# Patient Record
Sex: Female | Born: 1990 | Race: Black or African American | Hispanic: No | Marital: Single | Smoking: Never smoker
Health system: Southern US, Community
[De-identification: ages and names within clinical notes are randomized; demographics above are authoritative.]

## PROBLEM LIST (undated history)

## (undated) DIAGNOSIS — G459 Transient cerebral ischemic attack, unspecified: Secondary | ICD-10-CM

## (undated) HISTORY — PX: INDUCED ABORTION: SHX677

---

## 2015-02-19 ENCOUNTER — Encounter (HOSPITAL_COMMUNITY): Payer: Self-pay | Admitting: Emergency Medicine

## 2015-02-19 ENCOUNTER — Emergency Department (HOSPITAL_COMMUNITY)
Admission: EM | Admit: 2015-02-19 | Discharge: 2015-02-19 | Disposition: A | Discharge status: Home / Self Care | Payer: Medicaid - Out of State | Attending: Emergency Medicine | Admitting: Emergency Medicine

## 2015-02-19 DIAGNOSIS — Z79899 Other long term (current) drug therapy: Secondary | ICD-10-CM | POA: Diagnosis not present

## 2015-02-19 DIAGNOSIS — L02412 Cutaneous abscess of left axilla: Secondary | ICD-10-CM | POA: Diagnosis present

## 2015-02-19 MED ORDER — LIDOCAINE HCL (PF) 2 % IJ SOLN
0.0000 mL | Freq: Once | INTRAMUSCULAR | Status: DC | PRN
  Filled 2015-02-19 (×3): qty 20

## 2015-02-19 NOTE — ED Notes (Signed)
Pt is pregnant

## 2015-02-19 NOTE — ED Provider Notes (Signed)
CSN: [id number]     Arrival date & time 02/19/15  1325 History  This chart was scribed for non-physician provider Oswaldo Conroy, PA-C, working with Rolan Bucco, MD by Phillis Haggis, ED Scribe. This patient was seen in room WTR8/WTR8 and patient care was started at 2:00 PM.   Chief Complaint  Patient presents with  . Abscess   Patient is a 24 y.o. female presenting with abscess. The history is provided by the patient. No language interpreter was used.  Abscess Associated symptoms: no fever, no nausea and no vomiting   HPI Comments: Marissa Hull is a 24 y.o. female who presents to the Emergency Department complaining of a left axillary abscess onset one week ago. Patient states that this is the second abscess she has had in the same area. Patient reports pain with movement and has not tried anything for the pain.  Patient denies fevers, chills, nausea, or vomiting. Patient denies chronic medical conditions. Patient states that she is currently [redacted] weeks pregnant.   History reviewed. No pertinent past medical history. History reviewed. No pertinent past surgical history. No family history on file. History  Substance Use Topics  . Smoking status: Never Smoker   . Smokeless tobacco: Not on file  . Alcohol Use: No   OB History    Gravida Para Term Preterm AB TAB SAB Ectopic Multiple Living   1              Review of Systems  Constitutional: Negative for fever and chills.  Gastrointestinal: Negative for nausea and vomiting.  Skin: Positive for wound. Negative for pallor.   Allergies  Review of patient's allergies indicates no known allergies.  Home Medications   Prior to Admission medications   Medication Sig Start Date End Date Taking? Authorizing Provider  Prenatal Vit-Fe Fumarate-FA (PRENATAL MULTIVITAMIN) TABS tablet Take 1 tablet by mouth daily at 12 noon.   Yes Historical Provider, MD   BP 116/77 mmHg  Pulse 90  Temp(Src) 98 F (36.7 C) (Oral)  Resp 20  SpO2 100%   LMP 11/24/2014   Physical Exam  Constitutional: She appears well-developed and well-nourished. No distress.  HENT:  Head: Normocephalic and atraumatic.  Eyes: Conjunctivae are normal. Right eye exhibits no discharge. Left eye exhibits no discharge.  Pulmonary/Chest: Effort normal. No respiratory distress.  Neurological: She is alert. Coordination normal.  Skin: She is not diaphoretic. No erythema.  3 x 2 cm fluctuant mass to left axilla; no red streaks, no spontaneous drainage, no surrounding erythema or induration.   Psychiatric: She has a normal mood and affect. Her behavior is normal.  Nursing note and vitals reviewed.   ED Course  Procedures (including critical care time) DIAGNOSTIC STUDIES: Oxygen Saturation is 100% on room air, normal by my interpretation.    COORDINATION OF CARE: 2:01 PM-Discussed treatment plan which includes I&D and follow-up with pt at bedside and pt agreed to plan.   INCISION AND DRAINAGE PROCEDURE NOTE: Patient identification was confirmed and verbal consent was obtained. This procedure was performed by Oswaldo Conroy, PA-C,  at 6:19 PM. Site: left axilla Sterile procedures observed Needle size: 21g  Anesthetic used (type and amt): Lidocaine 2% without epinephrine Incision made with scalpel Drainage: Copious Complexity: Complex Packing used 1/4 inch iodoform Site anesthetized, incision made over site, wound drained and explored loculations, rinsed with copious amounts of normal saline, wound packed with sterile gauze, covered with dry, sterile dressing.  Pt tolerated procedure well without complications.  Instructions for care discussed  verbally and pt provided with additional written instructions for homecare and f/u.  Labs Review Labs Reviewed - No data to display  Imaging Review No results found.   EKG Interpretation None      MDM   Final diagnoses:  Abscess of axilla, left   Patient with skin abscess amenable to incision and  drainage.  Abscess with packing, wound recheck in 3 days. Encouraged home warm soaks and flushing.  Mild signs of cellulitis is surrounding skin.  Will d/c to home.  No antibiotic therapy is indicated  Discussed return precautions with patient. Discussed all results and patient verbalizes understanding and agrees with plan.  I personally performed the services described in this documentation, which was scribed in my presence. The recorded information has been reviewed and is accurate.     Oswaldo ConroyVictoria Ayanah Snader, PA-C 02/19/15 1819  Rolan BuccoMelanie Belfi, MD 02/24/15 [phone]

## 2015-02-19 NOTE — ED Notes (Signed)
Pt has abscess in left axilla x week.  Pt denies any drainage but is painful.

## 2015-02-19 NOTE — Discharge Instructions (Signed)
Return to the emergency room with worsening of symptoms, new symptoms or with symptoms that are concerning, especially redness, swelling, pus, fevers, generalized ill feeling. Continue with warm soaks. Follow-up with urgent care for recheck in 3 days. Read below information and follow recommendations. Abscess Care After An abscess (also called a boil or furuncle) is an infected area that contains a collection of pus. Signs and symptoms of an abscess include pain, tenderness, redness, or hardness, or you may feel a moveable soft area under your skin. An abscess can occur anywhere in the body. The infection may spread to surrounding tissues causing cellulitis. A cut (incision) by the surgeon was made over your abscess and the pus was drained out. Gauze may have been packed into the space to provide a drain that will allow the cavity to heal from the inside outwards. The boil may be painful for 5 to 7 days. Most people with a boil do not have high fevers. Your abscess, if seen early, may not have localized, and may not have been lanced. If not, another appointment may be required for this if it does not get better on its own or with medications. HOME CARE INSTRUCTIONS   Only take over-the-counter or prescription medicines for pain, discomfort, or fever as directed by your caregiver.  When you bathe, soak and then remove gauze or iodoform packs at least daily or as directed by your caregiver. You may then wash the wound gently with mild soapy water. Repack with gauze or do as your caregiver directs. SEEK IMMEDIATE MEDICAL CARE IF:   You develop increased pain, swelling, redness, drainage, or bleeding in the wound site.  You develop signs of generalized infection including muscle aches, chills, fever, or a general ill feeling.  An oral temperature above 102 F (38.9 C) develops, not controlled by medication. See your caregiver for a recheck if you develop any of the symptoms described above. If  medications (antibiotics) were prescribed, take them as directed. Document Released: 05/16/2005 Document Revised: 01/20/2012 Document Reviewed: 01/11/2008 [facility] Patient Information 2015 [place]. This information is not intended to replace advice given to you by your health care provider. Make sure you discuss any questions you have with your health care provider.

## 2016-06-14 ENCOUNTER — Emergency Department
Admission: EM | Admit: 2016-06-14 | Discharge: 2016-06-14 | Disposition: A | Discharge status: Home / Self Care | Payer: Medicaid Other | Attending: Emergency Medicine | Admitting: Emergency Medicine

## 2016-06-14 ENCOUNTER — Emergency Department: Payer: MEDICAID

## 2016-06-14 DIAGNOSIS — T148XXA Other injury of unspecified body region, initial encounter: Secondary | ICD-10-CM

## 2016-06-14 DIAGNOSIS — S39012A Strain of muscle, fascia and tendon of lower back, initial encounter: Secondary | ICD-10-CM | POA: Insufficient documentation

## 2016-06-14 DIAGNOSIS — S29012A Strain of muscle and tendon of back wall of thorax, initial encounter: Secondary | ICD-10-CM | POA: Insufficient documentation

## 2016-06-14 MED ORDER — NAPROXEN 250 MG PO TABS
500.0000 mg | ORAL_TABLET | Freq: Once | ORAL | Status: AC
Start: 2016-06-14 — End: 2016-06-14
  Administered 2016-06-14: 500 mg via ORAL
  Filled 2016-06-14: qty 2

## 2016-06-14 MED ORDER — CYCLOBENZAPRINE HCL 10 MG PO TABS
10.0000 mg | ORAL_TABLET | Freq: Every evening | ORAL | Status: AC | PRN
Start: 2016-06-14 — End: 2016-06-29

## 2016-06-14 MED ORDER — NAPROXEN 500 MG PO TABS
500.0000 mg | ORAL_TABLET | Freq: Two times a day (BID) | ORAL | Status: DC
Start: 2016-06-14 — End: 2017-03-03

## 2016-06-14 NOTE — ED Notes (Signed)
Refer to quick triage. Pain 10/10

## 2016-06-14 NOTE — ED Notes (Signed)
The patient was seen and examined by PA or Fellow; plan of care was discussed with me. I have also seen and examined the patient personally and concur with PA/NP plan of care.     Loretha Stapler Eutha Cude  11:13 AM  06/14/2016          Latanya Presser, MD  06/14/16 [phone]

## 2016-06-14 NOTE — ED Notes (Signed)
Bed: N S3  Expected date:   Expected time:   Means of arrival:   Comments:
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
## 2016-06-14 NOTE — Discharge Instructions (Signed)
Dear Ms. Crystal Morrison:    Thank you for choosing the [facility] Emergency Department, the premier emergency department in the [place] area.  I hope your visit today was EXCELLENT.    Specific instructions for your visit today:    Continue with naproxen and flexeril as needed for pain.  Follow up with primary care provider.    Muscle Strain, General    You have been diagnosed with a muscle strain.    Any muscle in the body can be strained. A strain is an injury to muscles where some muscle fibers are injured by being stretched or partly torn. This usually happens from using the muscle too much or from doing an activity the muscle is not used to.    Some strain symptoms are pain, muscle cramping and soreness to the touch.    Often, muscle pain and stiffness are worse the next day. This is much like what happens when someone starts exercising for the first time. After exercising, the person may feel pretty good. However, the next day all the exercised muscles are stiff and sore.    General strain treatment includes:   Resting the affected part.   Pain medicine.   Muscle relaxant medicines.   Warm compresses (such as a warm, moist towel).   Gently stretching the injured muscle.   When tolerated, gently massaging the injured area.    This injury is self-limited (it gets better on its own). It rarely needs specific treatment.    YOU SHOULD SEEK MEDICAL ATTENTION IMMEDIATELY, EITHER HERE OR AT THE NEAREST EMERGENCY DEPARTMENT, IF ANY OF THE FOLLOWING OCCURS:   Major increase in swelling of the affected area.   Pain gets worse instead of gradually improving.   Skin gets red over the affected area.   Unable to use the affected limb. Limb weakness or numbness.                If you do not continue to improve or your condition worsens, please contact your doctor or return immediately to the Emergency Department.    Sincerely,  No att. providers found - Attending Emergency Physician  Doristine Mango, PA-C -  Physician Assistant  [facility] Emergency Department    ONSITE PHARMACY  Our full service onsite pharmacy is located in the ER waiting room.  Open 7 days a week from 9 am to 11 pm.  We accept all major insurances and prices are competitive with major retailers.  Ask your provider to print your prescriptions down to the pharmacy to speed you on your way home.    OBTAINING A PRIMARY CARE APPOINTMENT    Primary care physicians (PCPs, also known as primary care doctors) are either internists or family medicine doctors. Both types of PCPs focus on health promotion, disease prevention, patient education and counseling, and treatment of acute and chronic medical conditions.    Call for an appointment with a primary care doctor.  Ask to see who is taking new patients.     [facility]  telephone:  [phone]  [email or web address]    DOCTOR REFERRALS  Call [phone] (available 24 hours a day, 7 days a week) if you need any further referrals and we can help you find a primary care doctor or specialist.  Also, available online at:  [email or web address]    YOUR CONTACT INFORMATION  Before leaving please check with registration to make sure we have an up-to-date contact number.  You can call registration at [phone]  to update your information.  For questions about your hospital bill, please call [phone].  For questions about your Emergency Dept Physician bill please call [phone].      FREE HEALTH SERVICES  If you need help with health or social services, please call 2-1-1 for a free referral to resources in your area.  2-1-1 is a free service connecting people with information on health insurance, free clinics, pregnancy, mental health, dental care, food assistance, housing, and substance abuse counseling.  Also, available online at:  http://www.[place].org    MEDICAL RECORDS AND TESTS  Certain laboratory test results do not come back the same day, for example urine  cultures.   We will contact you if other important findings are noted.  Radiology films are often reviewed again to ensure accuracy.  If there is any discrepancy, we will notify you.      Please call [phone] to pick up a complimentary CD of any radiology studies performed.  If you or your doctor would like to request a copy of your medical records, please call [phone].      ORTHOPEDIC INJURY   Please know that significant injuries can exist even when an initial x-ray is read as normal or negative.  This can occur because some fractures (broken bones) are not initially visible on x-rays.  For this reason, close outpatient follow-up with your primary care doctor or bone specialist (orthopedist) is required.    MEDICATIONS AND FOLLOWUP  Please be aware that some prescription medications can cause drowsiness.  Use caution when driving or operating machinery.    The examination and treatment you have received in our Emergency Department is provided on an emergency basis, and is not intended to be a substitute for your primary care physician.  It is important that your doctor checks you again and that you report any new or remaining problems at that time.      24 HOUR PHARMACIES  The nearest 24 hour pharmacy is:    [facility]  [address]  [place], [place] [address]  [phone]      ASSISTANCE WITH INSURANCE    Affordable Care Act  [facility])  Call to start or finish an application, compare plans, enroll or ask a question.  [phone]  TTY: [phone]  Web:  Healthcare.gov    Help Enrolling in [facility]  [employer] [place]  407-009-4801 (TOLL-FREE)  (407)039-6955 (TTY)  Web:  Http://www.coverva.org    Local Help Enrolling in the [facility]  [place] [facility]  519-514-4109 (MAIN)  Email:  health-help@nvfs .org  Web:  BlackjackMyths.is  Address:  [address], [address] [place], [place] [address]    SEDATING MEDICATIONS  Sedating medications include strong pain medications  (e.g. narcotics), muscle relaxers, benzodiazepines (used for anxiety and as muscle relaxers), Benadryl/diphenhydramine and other antihistamines for allergic reactions/itching, and other medications.  If you are unsure if you have received a sedating medication, please ask your physician or nurse.  If you received a sedating medication: DO NOT drive a car. DO NOT operate machinery. DO NOT perform jobs where you need to be alert.  DO NOT drink alcoholic beverages while taking this medicine.     If you get dizzy, sit or lie down at the first signs. Be careful going up and down stairs.  Be extra careful to prevent falls.     Never give this medicine to others.     Keep this medicine out of reach of children.  Do not take or save old medicines. Throw them away when outdated.     Keep all medicines in a cool, dry place. DO NOT keep them in your bathroom medicine cabinet or in a cabinet above the stove.    MEDICATION REFILLS  Please be aware that we cannot refill any prescriptions through the ER. If you need further treatment from what is provided at your ER visit, please follow up with your primary care doctor or your pain management specialist.    FREESTANDING EMERGENCY DEPARTMENTS OF [facility]  Did you know [facility] has two freestanding ERs located just a few miles away?  [facility] [place] and [facility] [place] have short wait times, easy free parking directly in front of the building and top patient satisfaction scores - and the same Board Certified Emergency Medicine doctors as [facility].              DESTYNIE TOOMEY  [id number]  [phone]  [phone]  06/14/2016    Discharge Instructions    As always, you are the most important factor in your recovery.  Please follow these instructions carefully.  If you have problems that we have not discussed, CALL OR VISIT YOUR DOCTOR RIGHT AWAY.     If you can't reach your doctor, return to the emergency department.    I Vivi Martens  understand the written and discussed instructions.  My questions have been answered.  I acknowledge receipt of these instructions.     Patient or responsible person:         Patient's Signature               Physician or Nurse

## 2017-03-03 ENCOUNTER — Emergency Department: Payer: Self-pay

## 2017-03-03 ENCOUNTER — Emergency Department
Admission: EM | Admit: 2017-03-03 | Discharge: 2017-03-03 | Disposition: A | Discharge status: Home / Self Care | Payer: Self-pay | Attending: Emergency Medicine | Admitting: Emergency Medicine

## 2017-03-03 DIAGNOSIS — Y92410 Unspecified street and highway as the place of occurrence of the external cause: Secondary | ICD-10-CM | POA: Insufficient documentation

## 2017-03-03 DIAGNOSIS — Y990 Civilian activity done for income or pay: Secondary | ICD-10-CM | POA: Insufficient documentation

## 2017-03-03 DIAGNOSIS — M545 Low back pain: Secondary | ICD-10-CM | POA: Insufficient documentation

## 2017-03-03 DIAGNOSIS — M549 Dorsalgia, unspecified: Secondary | ICD-10-CM

## 2017-03-03 DIAGNOSIS — S8011XA Contusion of right lower leg, initial encounter: Secondary | ICD-10-CM | POA: Insufficient documentation

## 2017-03-03 LAB — URINE BHCG POC: Urine bHCG POC: NEGATIVE

## 2017-03-03 MED ORDER — CYCLOBENZAPRINE HCL 10 MG PO TABS
10.0000 mg | ORAL_TABLET | Freq: Three times a day (TID) | ORAL | 0 refills | Status: AC | PRN
Start: 2017-03-03 — End: ?

## 2017-03-03 MED ORDER — TRAMADOL HCL 50 MG PO TABS
50.0000 mg | ORAL_TABLET | Freq: Four times a day (QID) | ORAL | 0 refills | Status: AC | PRN
Start: 2017-03-03 — End: ?

## 2017-03-03 MED ORDER — NAPROXEN 250 MG PO TABS
500.0000 mg | ORAL_TABLET | Freq: Once | ORAL | Status: AC
Start: 2017-03-03 — End: 2017-03-03
  Administered 2017-03-03: 500 mg via ORAL
  Filled 2017-03-03: qty 2

## 2017-03-03 MED ORDER — CYCLOBENZAPRINE HCL 10 MG PO TABS
10.0000 mg | ORAL_TABLET | Freq: Once | ORAL | Status: AC
Start: 2017-03-03 — End: 2017-03-03
  Administered 2017-03-03: 10 mg via ORAL
  Filled 2017-03-03: qty 1

## 2017-03-03 MED ORDER — TRAMADOL HCL 50 MG PO TABS
50.0000 mg | ORAL_TABLET | Freq: Once | ORAL | Status: AC
Start: 2017-03-03 — End: 2017-03-03
  Administered 2017-03-03: 50 mg via ORAL
  Filled 2017-03-03: qty 1

## 2017-03-03 MED ORDER — HYDROCODONE-ACETAMINOPHEN 5-325 MG PO TABS
1.0000 | ORAL_TABLET | Freq: Once | ORAL | Status: AC
Start: 2017-03-03 — End: 2017-03-03
  Administered 2017-03-03: 1 via ORAL
  Filled 2017-03-03: qty 1

## 2017-03-03 MED ORDER — NAPROXEN 375 MG PO TABS
375.0000 mg | ORAL_TABLET | Freq: Two times a day (BID) | ORAL | 0 refills | Status: AC
Start: 2017-03-03 — End: ?
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
## 2018-06-08 ENCOUNTER — Emergency Department
Admission: EM | Admit: 2018-06-08 | Discharge: 2018-06-08 | Disposition: A | Discharge status: Home / Self Care | Payer: No Typology Code available for payment source | Attending: Emergency Medicine | Admitting: Emergency Medicine

## 2018-06-08 ENCOUNTER — Emergency Department: Payer: No Typology Code available for payment source

## 2018-06-08 DIAGNOSIS — R1111 Vomiting without nausea: Secondary | ICD-10-CM

## 2018-06-08 DIAGNOSIS — R1110 Vomiting, unspecified: Secondary | ICD-10-CM | POA: Insufficient documentation

## 2018-06-08 DIAGNOSIS — K769 Liver disease, unspecified: Secondary | ICD-10-CM | POA: Insufficient documentation

## 2018-06-08 DIAGNOSIS — R1084 Generalized abdominal pain: Secondary | ICD-10-CM | POA: Insufficient documentation

## 2018-06-08 LAB — HEMOLYSIS INDEX: Hemolysis Index: 316 — ABNORMAL HIGH (ref 0–18)

## 2018-06-08 LAB — COMPREHENSIVE METABOLIC PANEL
ALT: 15 U/L (ref 0–55)
AST (SGOT): 35 U/L — ABNORMAL HIGH (ref 5–34)
Albumin/Globulin Ratio: 0.9 (ref 0.9–2.2)
Albumin: 3.7 g/dL (ref 3.5–5.0)
Alkaline Phosphatase: 40 U/L (ref 37–106)
Anion Gap: 10 (ref 5.0–15.0)
BUN: 14 mg/dL (ref 7.0–19.0)
Bilirubin, Total: 0.2 mg/dL (ref 0.2–1.2)
CO2: 21 mEq/L — ABNORMAL LOW (ref 22–29)
Calcium: 8.9 mg/dL (ref 8.5–10.5)
Chloride: 107 mEq/L (ref 100–111)
Creatinine: 0.8 mg/dL (ref 0.6–1.0)
Globulin: 3.9 g/dL — ABNORMAL HIGH (ref 2.0–3.6)
Glucose: 101 mg/dL — ABNORMAL HIGH (ref 70–100)
Potassium: 4.8 mEq/L (ref 3.5–5.1)
Protein, Total: 7.6 g/dL (ref 6.0–8.3)
Sodium: 138 mEq/L (ref 136–145)

## 2018-06-08 LAB — CBC AND DIFFERENTIAL
Absolute NRBC: 0 10*3/uL (ref 0.00–0.00)
Basophils Absolute Automated: 0.04 10*3/uL (ref 0.00–0.08)
Basophils Automated: 0.6 %
Eosinophils Absolute Automated: 0.13 10*3/uL (ref 0.00–0.44)
Eosinophils Automated: 2.1 %
Hematocrit: 34.8 % (ref 34.7–43.7)
Hgb: 11.6 g/dL (ref 11.4–14.8)
Immature Granulocytes Absolute: 0.01 10*3/uL (ref 0.00–0.07)
Immature Granulocytes: 0.2 %
Lymphocytes Absolute Automated: 2.64 10*3/uL (ref 0.42–3.22)
Lymphocytes Automated: 42.2 %
MCH: 30.1 pg (ref 25.1–33.5)
MCHC: 33.3 g/dL (ref 31.5–35.8)
MCV: 90.4 fL (ref 78.0–96.0)
MPV: 9.7 fL (ref 8.9–12.5)
Monocytes Absolute Automated: 0.39 10*3/uL (ref 0.21–0.85)
Monocytes: 6.2 %
Neutrophils Absolute: 3.05 10*3/uL (ref 1.10–6.33)
Neutrophils: 48.7 %
Nucleated RBC: 0 /100 WBC (ref 0.0–0.0)
Platelets: 274 10*3/uL (ref 142–346)
RBC: 3.85 10*6/uL — ABNORMAL LOW (ref 3.90–5.10)
RDW: 13 % (ref 11–15)
WBC: 6.26 10*3/uL (ref 3.10–9.50)

## 2018-06-08 LAB — LIPASE: Lipase: 28 U/L (ref 8–78)

## 2018-06-08 LAB — URINALYSIS, REFLEX TO MICROSCOPIC EXAM IF INDICATED
Bilirubin, UA: NEGATIVE
Blood, UA: NEGATIVE
Glucose, UA: NEGATIVE
Ketones UA: NEGATIVE
Leukocyte Esterase, UA: NEGATIVE
Nitrite, UA: NEGATIVE
Protein, UR: NEGATIVE
Specific Gravity UA: 1.028 (ref 1.001–1.035)
Urine pH: 6 (ref 5.0–8.0)
Urobilinogen, UA: NEGATIVE mg/dL

## 2018-06-08 LAB — GFR: EGFR: >60

## 2018-06-08 LAB — URINE BHCG POC: Urine bHCG POC: NEGATIVE

## 2018-06-08 MED ORDER — ONDANSETRON HCL 4 MG/2ML IJ SOLN
4.0000 mg | Freq: Once | INTRAMUSCULAR | Status: AC
Start: 2018-06-08 — End: 2018-06-08
  Administered 2018-06-08: 4 mg via INTRAVENOUS
  Filled 2018-06-08: qty 2

## 2018-06-08 MED ORDER — IOHEXOL 350 MG/ML IV SOLN
100.0000 mL | Freq: Once | INTRAVENOUS | Status: AC | PRN
Start: 2018-06-08 — End: 2018-06-08
  Administered 2018-06-08: 100 mL via INTRAVENOUS

## 2018-06-08 MED ORDER — MORPHINE SULFATE 4 MG/ML IJ/IV SOLN (WRAP)
4.0000 mg | Freq: Once | Status: AC
Start: 2018-06-08 — End: 2018-06-08
  Administered 2018-06-08: 4 mg via INTRAVENOUS
  Filled 2018-06-08: qty 1

## 2018-06-08 MED ORDER — SODIUM CHLORIDE 0.9 % IV BOLUS
1000.0000 mL | Freq: Once | INTRAVENOUS | Status: AC
Start: 2018-06-08 — End: 2018-06-08
  Administered 2018-06-08: 1000 mL via INTRAVENOUS

## 2018-06-08 MED ORDER — ONDANSETRON 4 MG PO TBDP
4.0000 mg | ORAL_TABLET | Freq: Four times a day (QID) | ORAL | 0 refills | Status: DC | PRN
Start: 2018-06-08 — End: 2018-09-27

## 2018-06-08 NOTE — Discharge Instructions (Signed)
You were seen in the [facility] Emergency Department by Dr. Reginia Forts    For home care, please take medication as directed.  Follow-up with your primary care doctor for further evaluation of liver lesions that were seen on CT scan in the next 2 to 3 days.  You will need an MRI to further evaluate the cause of these liver lesions.     Please make sure to return if you have return of abdominal pain, repeated vomiting, blood in your vomit or stool, fever greater than 100.4 F, chest pain, shortness of breath, back pain, difficulty urinating if you are not getting better, or any other new or worsening or concerning symptoms.  We are always open and happy to help.    Please be aware that an emergency department diagnosis is usually preliminary, and that a definitive diagnosis cannot be made without the help of time or further testing.  Every disease has a progression and may take time before it is recognizable by a physician.  It is extremely important that you return to the Emergency Department or see your own doctor if you do not improve, or especially if your symptoms worsen or change.     In short, do not hesitate to return to the emergency department if you sense something is not right. We are never upset to see you again.    Take your discharge instructions to your primary care doctor and all other follow-up care so they have a record of what happened in your visit today. Emergency care is not a substitute for general medical care.    Your CT showed multiple non emergent liver lesions.  Please schedule an MRI at your earliest convenience.       Abdominal Pain    You have been diagnosed with abdominal (belly) pain. The cause of your pain is not yet known.    Many things can cause abdominal pain. Examples include viral infections and bowel (intestine) spasms. You might need another examination or more tests to find out why you have pain.    At this time, your pain does not seem to be caused by  anything dangerous. You do not need surgery. You do not need to stay in the hospital.     Though we don't believe your condition is dangerous right now, it is important to be careful. Sometimes a problem that seems mild can become serious later. This is why it is very important that you return here or go to the nearest Emergency Department unless you are 100% improved.    YOU SHOULD SEEK MEDICAL ATTENTION IMMEDIATELY, EITHER HERE OR AT THE NEAREST EMERGENCY DEPARTMENT, IF ANY OF THE FOLLOWING OCCURS:   Your pain does not go away or gets worse.   You cannot keep fluids down or your vomit is dark green.    You vomit blood or see blood in your stool. Blood might be bright red or dark red. It can also be black and look like tar.   You have a fever (temperature higher than 100.52F / 38C) or shaking chills.   Your skin or eyes look yellow or your urine looks brown.   You have severe diarrhea.      Vomiting    You have been seen for vomiting.    Vomiting (throwing-up) can be caused by many different things. Most of the time the cause IS NOT serious. The doctor feels it is OK for you to go home today.    Common  causes of vomiting include the following:   Gastroenteritis (stomach flu), usually with diarrhea.   Other illnesses. Sometimes medical conditions like diabetes, heart problems, headaches, or infections can make someone throw up.    Bowel obstructions (blockages) can cause vomiting and make patients unable to have bowel movements (stool) or pass gas.   Vomiting can be a symptom of appendicitis, especially if there is also pain in the right lower abdomen (belly).    Sometimes it is hard to find out what is causing the vomiting. Vomiting can be treated with anti-nausea medicines like promethazine (Phenergan), prochlorperazine (Compazine) or ondansetron (Zofran).    Try to drink liquids to avoid dehydration. Don't drink a lot of fluid all at once. Take small sips throughout the day.    YOU SHOULD  SEEK MEDICAL ATTENTION IMMEDIATELY, EITHER HERE OR AT THE NEAREST EMERGENCY DEPARTMENT, IF ANY OF THE FOLLOWING OCCURS:   You can't stop vomiting or your vomiting doesn't get better with medication.   You cannot keep liquids down.   You have severe sudden chest or belly pain after vomiting.   You have abdominal pain.

## 2018-06-08 NOTE — ED Provider Notes (Signed)
EMERGENCY DEPARTMENT HISTORY AND PHYSICAL EXAM     Physician/Midlevel provider first contact with patient: 06/08/18 0432         Date: 06/08/2018  Patient Name: Crystal Morrison    History of Presenting Illness     Chief Complaint   Patient presents with   . Abdominal Pain       History Provided By: Patient    Chief Complaint: abd pain  Duration: a few hours PTA  Timing:  Acute  Location: left sided  Quality: uncomfortable  Severity: Moderate  Exacerbating factors:   Alleviating factors:   Associated Symptoms: vomiting  Pertinent Negatives: bloody stools, hematemesis, diarrhea, fever, abd surgery, vaginal bleeding, vaginal discharge, possibility of being pregnant    Additional History: Crystal Morrison is a 27 y.o. female with a hx of gall stones presenting to the ED with acute left sided abd pain with associated vomiting.  Pt states she woke up a few hours PTA and vomited in bed with 3 more episodes of vomiting.  The vomiting has resolved but her abd still hurts.  Denies bloody stools, hematemesis, diarrhea, fever, vaginal bleeding, vaginal discharge, possibility of being pregnant, or hx of abd surgery or pancreatitis.    PCP: Pcp, [place], MD  SPECIALISTS:    No current facility-administered medications for this encounter.      Current Outpatient Prescriptions   Medication Sig Dispense Refill   . cyclobenzaprine (FLEXERIL) 10 MG tablet Take 1 tablet (10 mg total) by mouth 3 (three) times daily as needed (pain to muscles). 12 tablet 0   . naproxen (NAPROSYN) 375 MG tablet Take 1 tablet (375 mg total) by mouth 2 (two) times daily with meals. 14 tablet 0   . ondansetron (ZOFRAN-ODT) 4 MG disintegrating tablet Take 1 tablet (4 mg total) by mouth every 6 (six) hours as needed for Nausea 8 tablet 0   . traMADol (ULTRAM) 50 MG tablet Take 1 tablet (50 mg total) by mouth every 6 (six) hours as needed for Pain.Do not drive or operate machinery while taking this medication 8 tablet 0       Past History     Past  Medical History:  History reviewed. No pertinent past medical history.    Past Surgical History:  Past Surgical History:   Procedure Laterality Date   . ANKLE SURGERY Right    . WRIST SURGERY Right        Family History:  No family history on file.    Social History:  Social History   Substance Use Topics   . Smoking status: Never Smoker   . Smokeless tobacco: Never Used   . Alcohol use Yes      Comment: socially       Allergies:  No Known Allergies    Review of Systems     Review of Systems   Constitutional: Negative for fever.   HENT: Negative for sore throat.    Eyes: Negative for visual disturbance.   Respiratory: Negative for shortness of breath.    Cardiovascular: Negative for chest pain.   Gastrointestinal: Positive for abdominal pain and vomiting. Negative for blood in stool.   Genitourinary: Negative for dysuria, vaginal bleeding and vaginal discharge.   Musculoskeletal: Negative for back pain.   Skin: Negative for rash.   Neurological: Negative for headaches.     Physical Exam   BP 126/84   Pulse (!) 59   Temp 98.5 F (36.9 C) (Oral)   Resp 16   LMP  05/27/2018 (Exact Date)   SpO2 100%     Physical Exam   Constitutional: She is oriented to person, place, and time. She appears well-developed and well-nourished. No distress.   HENT:   Head: Normocephalic and atraumatic.   Mouth/Throat: Oropharynx is clear and moist.   Eyes: Pupils are equal, round, and reactive to light. EOM are normal.   Neck: Normal range of motion. Neck supple.   Cardiovascular: Normal rate, regular rhythm and intact distal pulses.    Pulmonary/Chest: Effort normal and breath sounds normal. No respiratory distress. She has no wheezes.   Abdominal: Soft. She exhibits no distension. There is tenderness in the epigastric area, left upper quadrant and left lower quadrant. There is no rigidity, no rebound, no guarding, no CVA tenderness, no tenderness at McBurney's point and negative Murphy's sign.   Musculoskeletal: Normal range of  motion.   Neurological: She is alert and oriented to person, place, and time.   Skin: Skin is warm and dry.   Psychiatric: She has a normal mood and affect.   Vitals reviewed.    Diagnostic Study Results     Labs -     Results     Procedure Component Value Units Date/Time    Comprehensive metabolic panel [[id number]]  (Abnormal) Collected:  06/08/18 0505    Specimen:  Blood Updated:  06/08/18 0537     Glucose 101 (H) mg/dL      BUN 08.6 mg/dL      Creatinine 0.8 mg/dL      Sodium 578 mEq/L      Potassium 4.8 mEq/L      Chloride 107 mEq/L      CO2 21 (L) mEq/L      Calcium 8.9 mg/dL      Protein, Total 7.6 g/dL      Albumin 3.7 g/dL      AST (SGOT) 35 (H) U/L      ALT 15 U/L      Alkaline Phosphatase 40 U/L      Bilirubin, Total 0.2 mg/dL      Globulin 3.9 (H) g/dL      Albumin/Globulin Ratio 0.9     Anion Gap 10.0    Lipase [[id number]] Collected:  06/08/18 0505    Specimen:  Blood Updated:  06/08/18 0537     Lipase 28 U/L     Hemolysis index [[id number]]  (Abnormal) Collected:  06/08/18 0505     Updated:  06/08/18 0537     Hemolysis Index 316 (H)    GFR [[id number]] Collected:  06/08/18 0505     Updated:  06/08/18 0537     EGFR >60.0    UA, Reflex to Microscopic (pts 3 + yrs) [[id number]] Collected:  06/08/18 0505    Specimen:  Urine Updated:  06/08/18 0526     Urine Type Clean Catch     Color, UA Yellow     Clarity, UA Clear     Specific Gravity UA 1.028     Urine pH 6.0     Leukocyte Esterase, UA Negative     Nitrite, UA Negative     Protein, UR Negative     Glucose, UA Negative     Ketones UA Negative     Urobilinogen, UA Negative mg/dL      Bilirubin, UA Negative     Blood, UA Negative    Urine BHCG POC [[id number]] Collected:  06/08/18 0520     Updated:  06/08/18 0525     Urine bHCG  POC Negative    CBC with differential [[id number]]  (Abnormal) Collected:  06/08/18 0505    Specimen:  Blood from Blood Updated:  06/08/18 0521     WBC 6.26 x10 3/uL      Hgb 11.6 g/dL      Hematocrit 81.1 %      Platelets 274 x10 3/uL       RBC 3.85 (L) x10 6/uL      MCV 90.4 fL      MCH 30.1 pg      MCHC 33.3 g/dL      RDW 13 %      MPV 9.7 fL      Neutrophils 48.7 %      Lymphocytes Automated 42.2 %      Monocytes 6.2 %      Eosinophils Automated 2.1 %      Basophils Automated 0.6 %      Immature Granulocyte 0.2 %      Nucleated RBC 0.0 /100 WBC      Neutrophils Absolute 3.05 x10 3/uL      Abs Lymph Automated 2.64 x10 3/uL      Abs Mono Automated 0.39 x10 3/uL      Abs Eos Automated 0.13 x10 3/uL      Absolute Baso Automated 0.04 x10 3/uL      Absolute Immature Granulocyte 0.01 x10 3/uL      Absolute NRBC 0.00 x10 3/uL           Radiologic Studies -   Radiology Results (24 Hour)     Procedure Component Value Units Date/Time    CT Abd/Pelvis with IV Contrast only [[id number]] Collected:  06/08/18 2130    Order Status:  Completed Updated:  06/08/18 8657    Narrative:       CT ABDOMEN PELVIS W IV/ WO PO CONT      HISTORY: generalized and left sided abd pain.        COMPARISON: None.    TECHNIQUE: Multiple axial CT images were obtained through the abdomen  and pelvis after the administration of 100 cc of Omnipaque 350   intravenous contrast. Oral contrast was not given. Coronal and sagittal  MPR  images were reconstructed. One of the following dose optimization  techniques was utilized in the performance of this exam: Automated  exposure control; adjustment of the mA and/or kV according to the  patient's size; or use of an iterative  reconstruction technique.   Specific details can be referenced in the facility's radiology CT exam  operational policy.    FINDINGS:    Lung bases:  Clear.    Hepatobiliary: There are multiple enhancing lesions in the liver. The  largest is in segment 6 measuring 2.1 x 1.8 cm. The gallbladder is  normal. The common bile duct is normal in size.     Spleen: Normal.    Pancreas: Normal.    Adrenals/Kidneys: The adrenal glands are normal. Kidneys are normal. No  stones or hydronephrosis.        Bowel/Mesentery/peritoneum:  Appendix is normal. Bowel is normal in  caliber.     No free air. No free fluid.    Lymph nodes: No enlarged lymph nodes.    Vessels: Negative.    Pelvic GU: The bladder is normal. The uterus is unremarkable.    Other: Negative.    Musculoskeletal: There are no suspicious bony lesions.          Impression:  1. Multiple enhancing lesions in the liver are indeterminate but  possibly represent benign lesions such as hemangiomas or focal nodular  hyperplasia. Follow-up with nonemergent MRI of the abdomen with and  without contrast is recommended.    Jorene Guest, MD   06/08/2018 6:29 AM      .    Medical Decision Making   I am the first provider for this patient.    I reviewed the vital signs, available nursing notes, past medical history, past surgical history, family history and social history.    Vital Signs-Reviewed the patient's vital signs.     Patient Vitals for the past 12 hrs:   BP Temp Pulse Resp   06/08/18 0623 126/84 - (!) 59 16   06/08/18 0421 112/71 98.5 F (36.9 C) 69 16       Pulse Oximetry Analysis - Normal 99% on RA    Cardiac Monitor:  Rate: 59  Rhythm:  Normal Sinus Rhythm   Time: 0623    Old Medical Records: Nursing notes.     ED Course:     4:34 AM - Discussed plan for testing and anti emetics with pt, who agrees.    7:01 AM - Updated pt on CT results. Discussed f/u with PCP, usage of Zofran, home self care, discharge instructions, and return precautions with patient. Possibility of evolving illness reviewed. All questions solicited and addressed. Patient states understanding and amenable to discharge.      Provider Notes:   27 year old female presenting with left-sided abdominal pain nonbilious nonbloody emesis after excessive consumption of alcohol.  Laboratory studies without signs of acute pathology.  CT scan shows multiple liver lesions without signs of other acute pathology.  Discussed with patient necessity to obtain outpatient MRI and continued evaluation with PCP regarding liver  lesions and that cancer and other pathology cannot be ruled out at this time until further work-up obtained.  Patient agreed and understood plan all questions answered.  IV fluids and Zofran given with improvement of symptoms.    I have considered possible diagnosis for the patient's presentation and based on the history and exam and studies, I do not feel she has ectopic pregnancy/toa/pid/torsion/obstruction/chole/appy/ascending biliary illness/pancreatitis/ruptured aaa/pyelonephritis OR other sbi/surgical pathology.      On revaluation and discharge patient well appearing, with No acute distress, abdomen soft nontender, tolerating PO, D/W patient ED workup. d/w patient no evidence of significant other etiologies to explain patient presentation. Also discussed red flags, concerning symptoms, reviewed possibility of progression of disease and diagnostic uncertainty.  Reviewed return precautions in detail. Will D/c patient w/ close f/u w/ PMD or ED if patient unable to have a follow up appointment. Will return with any worsening symptoms or any other concerns for further work up. All questions were answered, and the patient is comfortable with the disposition. Patient agreed and understood plan.   Diagnosis     Clinical Impression:   1. Generalized abdominal pain    2. Liver lesion    3. Non-intractable vomiting without nausea, unspecified vomiting type        Treatment Plan:   ED Disposition     ED Disposition Condition Date/Time Comment    Discharge  Mon Jun 08, 2018  6:47 AM Crystal Morrison discharge to home/self care.    Condition at disposition: Stable            _______________________________      Attestations: This note is prepared by Mathis Fare, acting as scribe for  Reginia Forts, MD.    Reginia Forts, MD - The scribe's documentation has been prepared under my direction and personally reviewed by me in its entirety.  I confirm that the note above accurately reflects all work,  treatment, procedures, and medical decision making performed by me.    _______________________________       Marko Stai, MD  06/10/18 [phone]

## 2018-06-08 NOTE — ED Triage Notes (Signed)
Pt reports burning abdominal pain with 4 episodes of vomiting starting at 0200.

## 2018-09-27 ENCOUNTER — Emergency Department
Admission: EM | Admit: 2018-09-27 | Discharge: 2018-09-27 | Disposition: A | Discharge status: Home / Self Care | Payer: Medicaid Other | Attending: Emergency Medicine | Admitting: Emergency Medicine

## 2018-09-27 ENCOUNTER — Emergency Department: Payer: Medicaid Other

## 2018-09-27 DIAGNOSIS — K529 Noninfective gastroenteritis and colitis, unspecified: Secondary | ICD-10-CM | POA: Insufficient documentation

## 2018-09-27 DIAGNOSIS — R197 Diarrhea, unspecified: Secondary | ICD-10-CM

## 2018-09-27 LAB — URINALYSIS, REFLEX TO MICROSCOPIC EXAM IF INDICATED
Bilirubin, UA: NEGATIVE
Blood, UA: NEGATIVE
Glucose, UA: NEGATIVE
Ketones UA: NEGATIVE
Leukocyte Esterase, UA: NEGATIVE
Nitrite, UA: NEGATIVE
Protein, UR: 30 — AB
Specific Gravity UA: 1.033 (ref 1.001–1.035)
Urine pH: 5 (ref 5.0–8.0)
Urobilinogen, UA: NEGATIVE mg/dL (ref 0.2–2.0)

## 2018-09-27 LAB — CBC AND DIFFERENTIAL
Absolute NRBC: 0 10*3/uL (ref 0.00–0.00)
Basophils Absolute Automated: 0.01 10*3/uL (ref 0.00–0.08)
Basophils Automated: 0.3 %
Eosinophils Absolute Automated: 0 10*3/uL (ref 0.00–0.44)
Eosinophils Automated: 0 %
Hematocrit: 40.2 % (ref 34.7–43.7)
Hgb: 13.6 g/dL (ref 11.4–14.8)
Immature Granulocytes Absolute: 0.02 10*3/uL (ref 0.00–0.07)
Immature Granulocytes: 0.6 %
Lymphocytes Absolute Automated: 0.75 10*3/uL (ref 0.42–3.22)
Lymphocytes Automated: 23.2 %
MCH: 30.4 pg (ref 25.1–33.5)
MCHC: 33.8 g/dL (ref 31.5–35.8)
MCV: 89.9 fL (ref 78.0–96.0)
MPV: 9.2 fL (ref 8.9–12.5)
Monocytes Absolute Automated: 0.33 10*3/uL (ref 0.21–0.85)
Monocytes: 10.2 %
Neutrophils Absolute: 2.12 10*3/uL (ref 1.10–6.33)
Neutrophils: 65.7 %
Nucleated RBC: 0 /100 WBC (ref 0.0–0.0)
Platelets: 256 10*3/uL (ref 142–346)
RBC: 4.47 10*6/uL (ref 3.90–5.10)
RDW: 13 % (ref 11–15)
WBC: 3.23 10*3/uL (ref 3.10–9.50)

## 2018-09-27 LAB — COMPREHENSIVE METABOLIC PANEL
ALT: 14 U/L (ref 0–55)
AST (SGOT): 25 U/L (ref 5–34)
Albumin/Globulin Ratio: 1.2 (ref 0.9–2.2)
Albumin: 3.8 g/dL (ref 3.5–5.0)
Alkaline Phosphatase: 44 U/L (ref 37–106)
Anion Gap: 8 (ref 5.0–15.0)
BUN: 8 mg/dL (ref 7.0–19.0)
Bilirubin, Total: 0.4 mg/dL (ref 0.2–1.2)
CO2: 25 mEq/L (ref 22–29)
Calcium: 8.6 mg/dL (ref 8.5–10.5)
Chloride: 104 mEq/L (ref 100–111)
Creatinine: 0.8 mg/dL (ref 0.6–1.0)
Globulin: 3.3 g/dL (ref 2.0–3.6)
Glucose: 91 mg/dL (ref 70–100)
Potassium: 3.7 mEq/L (ref 3.5–5.1)
Protein, Total: 7.1 g/dL (ref 6.0–8.3)
Sodium: 137 mEq/L (ref 136–145)

## 2018-09-27 LAB — LIPASE: Lipase: 16 U/L (ref 8–78)

## 2018-09-27 LAB — GFR: EGFR: >60

## 2018-09-27 LAB — HEMOLYSIS INDEX: Hemolysis Index: 4 (ref 0–18)

## 2018-09-27 LAB — URINE BHCG POC: Urine bHCG POC: NEGATIVE

## 2018-09-27 MED ORDER — PROMETHAZINE HCL 25 MG/ML IJ SOLN
12.50 mg | Freq: Once | INTRAMUSCULAR | Status: DC
Start: 2018-09-27 — End: 2018-09-27

## 2018-09-27 MED ORDER — ONDANSETRON 4 MG PO TBDP
4.00 mg | ORAL_TABLET | Freq: Four times a day (QID) | ORAL | 0 refills | Status: AC | PRN
Start: 2018-09-27 — End: ?

## 2018-09-27 MED ORDER — FAMOTIDINE 10 MG/ML IV SOLN (WRAP)
20.00 mg | Freq: Once | INTRAVENOUS | Status: AC
Start: 2018-09-27 — End: 2018-09-27
  Administered 2018-09-27: 20:00:00 20 mg via INTRAVENOUS
  Filled 2018-09-27: qty 2

## 2018-09-27 MED ORDER — ONDANSETRON 4 MG PO TBDP
4.00 mg | ORAL_TABLET | Freq: Once | ORAL | Status: AC
Start: 2018-09-27 — End: 2018-09-27
  Administered 2018-09-27: 20:00:00 4 mg via ORAL
  Filled 2018-09-27: qty 1

## 2018-09-27 MED ORDER — PROMETHAZINE HCL 25 MG/ML IJ SOLN
12.50 mg | Freq: Once | INTRAMUSCULAR | Status: AC
Start: 2018-09-27 — End: 2018-09-27
  Administered 2018-09-27: 21:00:00 12.5 mg via INTRAVENOUS
  Filled 2018-09-27 (×2): qty 1

## 2018-09-27 MED ORDER — SODIUM CHLORIDE 0.9 % IV BOLUS
1000.00 mL | Freq: Once | INTRAVENOUS | Status: AC
Start: 2018-09-27 — End: 2018-09-27
  Administered 2018-09-27: 20:00:00 1000 mL via INTRAVENOUS

## 2018-09-27 MED ORDER — DIPHENOXYLATE-ATROPINE 2.5-0.025 MG PO TABS
1.0000 | ORAL_TABLET | Freq: Four times a day (QID) | ORAL | Status: DC | PRN
Start: 2018-09-27 — End: 2018-09-28
  Administered 2018-09-27: 22:00:00 1 via ORAL
  Filled 2018-09-27: qty 1

## 2018-09-27 MED ORDER — IOHEXOL 350 MG/ML IV SOLN
100.00 mL | Freq: Once | INTRAVENOUS | Status: AC | PRN
Start: 2018-09-27 — End: 2018-09-27
  Administered 2018-09-27: 21:00:00 100 mL via INTRAVENOUS

## 2018-09-27 MED ORDER — DIPHENOXYLATE-ATROPINE 2.5-0.025 MG PO TABS
1.00 | ORAL_TABLET | Freq: Four times a day (QID) | ORAL | 0 refills | Status: AC | PRN
Start: 2018-09-27 — End: ?

## 2018-09-27 MED ORDER — KETOROLAC TROMETHAMINE 30 MG/ML IJ SOLN
30.00 mg | Freq: Once | INTRAMUSCULAR | Status: AC
Start: 2018-09-27 — End: 2018-09-27
  Administered 2018-09-27: 20:00:00 30 mg via INTRAVENOUS
  Filled 2018-09-27: qty 1

## 2018-09-27 NOTE — ED Provider Notes (Signed)
EMERGENCY DEPARTMENT HISTORY AND PHYSICAL EXAM     Physician/Midlevel provider first contact with patient: 09/27/18 1917         Date: 09/27/2018  Patient Name: Crystal Morrison    History of Presenting Illness     Chief Complaint   Patient presents with   . Emesis   . Diarrhea       History Provided By: Patient    Chief Complaint: abd pain  Duration: yesterday  Timing:  Constant  Location: epigastric  Quality: aching  Severity: Moderate  Exacerbating factors: none  Alleviating factors: none  Associated Symptoms: n/v/d  Pertinent Negatives: denies blood in the stool, fever    Additional History: Crystal Morrison is a 27 y.o. female presenting to the ED with constant aching epigastric abd pain with associated n/v/d since yesterday. Reports 7 episodes of NB emesis and 10+ episodes of diarrhea. No recent sick contact. Denies blood in the stool or fevers.       PCP: Pcp, None, MD  SPECIALISTS:    Current Facility-Administered Medications   Medication Dose Route Frequency Provider Last Rate Last Dose   . diphenoxylate-atropine (LOMOTIL) 2.5-0.025 MG per tablet 1 tablet  1 tablet Oral 4X Daily PRN Cherlyn Roberts, MD         Current Outpatient Medications   Medication Sig Dispense Refill   . cyclobenzaprine (FLEXERIL) 10 MG tablet Take 1 tablet (10 mg total) by mouth 3 (three) times daily as needed (pain to muscles). 12 tablet 0   . diphenoxylate-atropine (LOMOTIL) 2.5-0.025 MG per tablet Take 1 tablet by mouth 4 (four) times daily as needed for Diarrhea 12 tablet 0   . naproxen (NAPROSYN) 375 MG tablet Take 1 tablet (375 mg total) by mouth 2 (two) times daily with meals. 14 tablet 0   . ondansetron (ZOFRAN-ODT) 4 MG disintegrating tablet Take 1 tablet (4 mg total) by mouth every 6 (six) hours as needed for Nausea 8 tablet 0   . traMADol (ULTRAM) 50 MG tablet Take 1 tablet (50 mg total) by mouth every 6 (six) hours as needed for Pain.Do not drive or operate machinery while taking this medication 8 tablet 0        Past History     Past Medical History:  History reviewed. No pertinent past medical history.    Past Surgical History:  Past Surgical History:   Procedure Laterality Date   . ANKLE SURGERY Right    . WRIST SURGERY Right        Family History:  History reviewed. No pertinent family history.    Social History:  Social History     Tobacco Use   . Smoking status: Never Smoker   . Smokeless tobacco: Never Used   Substance Use Topics   . Alcohol use: Yes     Comment: socially   . Drug use: No       Allergies:  No Known Allergies    Review of Systems     Review of Systems   Constitutional: Negative for chills and fever.   HENT: Negative for congestion.    Eyes: Negative for visual disturbance.   Respiratory: Negative for cough.    Cardiovascular: Negative for chest pain.   Gastrointestinal: Positive for abdominal pain, diarrhea, nausea and vomiting. Negative for blood in stool.   Genitourinary: Negative for dysuria.   Musculoskeletal: Negative for myalgias.   Neurological: Negative for dizziness, numbness and headaches.   Psychiatric/Behavioral: Negative for confusion.  Physical Exam   BP 99/62   Pulse 76   Temp 98 F (36.7 C) (Oral)   Resp 12   Wt 66.2 kg   SpO2 98%   BMI 29.49 kg/m     Physical Exam   Constitutional: Oriented to person, place, and time  and in no distress.   Head: Normocephalic and atraumatic.   Mouth/Throat: Oropharynx is clear and moist.   Eyes: Conjunctivae normal and EOM are normal. Pupils are equal, round, and reactive to light.    Neck: Normal range of motion. Neck supple.   Cardiovascular: Normal rate, regular rhythm, normal heart sounds and intact distal pulses.  No murmur heard.  Pulmonary/Chest: Effort normal and breath sounds normal.   Abdominal: Soft. Non distended. There is epigastric and RLQ tenderness. No rebound or guarding  Musculoskeletal: No peripheral edema. No calf swelling or tenderness.    Neurological: Patient is alert and oriented to person, place, and time.  No cranial nerve deficit.  GCS score is 15.   Skin: Skin is warm and dry. No rash  Psychiatric: Affect normal.         Diagnostic Study Results     Labs -     Results     Procedure Component Value Units Date/Time    UA, Reflex to Microscopic (pts  3 + yrs) [[id number]]  (Abnormal) Collected:  09/27/18 1937    Specimen:  Urine Updated:  09/27/18 2005     Urine Type Clean Catch     Color, UA Amber     Clarity, UA Clear     Specific Gravity UA 1.033     Urine pH 5.0     Leukocyte Esterase, UA Negative     Nitrite, UA Negative     Protein, UR 30     Glucose, UA Negative     Ketones UA Negative     Urobilinogen, UA Negative mg/dL      Bilirubin, UA Negative     Blood, UA Negative     RBC, UA 0 - 2 /hpf      WBC, UA 0 - 5 /hpf      Squamous Epithelial Cells, Urine 0 - 5 /hpf      Urine Mucus Present    Comprehensive metabolic panel [[id number]] Collected:  09/27/18 1937    Specimen:  Blood Updated:  09/27/18 2004     Glucose 91 mg/dL      BUN 8.0 mg/dL      Creatinine 0.8 mg/dL      Sodium 782 mEq/L      Potassium 3.7 mEq/L      Chloride 104 mEq/L      CO2 25 mEq/L      Calcium 8.6 mg/dL      Protein, Total 7.1 g/dL      Albumin 3.8 g/dL      AST (SGOT) 25 U/L      ALT 14 U/L      Alkaline Phosphatase 44 U/L      Bilirubin, Total 0.4 mg/dL      Globulin 3.3 g/dL      Albumin/Globulin Ratio 1.2     Anion Gap 8.0    Lipase [[id number]] Collected:  09/27/18 1937    Specimen:  Blood Updated:  09/27/18 2004     Lipase 16 U/L     Hemolysis index [[id number]] Collected:  09/27/18 1937     Updated:  09/27/18 2004     Hemolysis Index 4    GFR [  [id number]] Collected:  09/27/18 1937     Updated:  09/27/18 2004     EGFR >60.0    Urine BHCG POC [[id number]] Collected:  09/27/18 1946     Updated:  09/27/18 1953     Urine bHCG POC Negative    CBC with differential [[id number]] Collected:  09/27/18 1937    Specimen:  Blood Updated:  09/27/18 1947     WBC 3.23 x10 3/uL      Hgb 13.6 g/dL      Hematocrit 64.4 %      Platelets 256 x10 3/uL       RBC 4.47 x10 6/uL      MCV 89.9 fL      MCH 30.4 pg      MCHC 33.8 g/dL      RDW 13 %      MPV 9.2 fL      Neutrophils 65.7 %      Lymphocytes Automated 23.2 %      Monocytes 10.2 %      Eosinophils Automated 0.0 %      Basophils Automated 0.3 %      Immature Granulocyte 0.6 %      Nucleated RBC 0.0 /100 WBC      Neutrophils Absolute 2.12 x10 3/uL      Abs Lymph Automated 0.75 x10 3/uL      Abs Mono Automated 0.33 x10 3/uL      Abs Eos Automated 0.00 x10 3/uL      Absolute Baso Automated 0.01 x10 3/uL      Absolute Immature Granulocyte 0.02 x10 3/uL      Absolute NRBC 0.00 x10 3/uL           Radiologic Studies -   Radiology Results (24 Hour)     Procedure Component Value Units Date/Time    CT Abdomen Pelvis W IV Contrast Only [[id number]] Collected:  09/27/18 2110    Order Status:  Completed Updated:  09/27/18 2120    Narrative:       History:   R lower abdominal TTP, vomiting and diarrhea, eval appendix    Technique: Thin section CT scan of the abdomen and pelvis        Oral contrast utilized:No oral contrast     IV contrast utilized:100 cc omnipaque 350      ABDOMEN FINDINGS:     LUNG BASES: Normal.     ADRENALS: Normal.     LIVER: Normal. No focal mass seen. Normal contour and density.    GALLBLADDER: Normal.    COMMON DUCT: Normal.    PANCREAS: Normal. No focal mass seen. Normal contour and size. No  peripancreatic stranding.    SPLEEN: Normal size, shape and appearance.    RIGHT KIDNEY: Normal. No focal mass or significant hydronephrosis or  obstructing stone.     LEFT KIDNEY: Normal. No focal mass or significant hydronephrosis or  obstructing stone.     IVC: Normal.    ABDOMINAL AORTA: Normal.    BOWEL: The appendix is not distended. A 4 mm in diameter. There is  anterior abdominal wall diastases without hernia. Scattered mild  enhancement of multiple ileal loops in the pelvis and right lower  quadrant are noted without distention. Moderate amount of fluid is seen  in the colon and small bowel with scattered  colonic air-fluid levels..  No small bowel obstruction or wall thickening.    BONES: Normal.    NODES: Normal.     PELVIC FINDINGS:  BLADDER AND URETERS: Normal.    VESSELS: Normal.    NODES: Normal.    UTERUS AND ADNEXA: Normal.        BONES: Normal.      Impression:        Enhancement of multiple ileal bowel loops which is  nonspecific finding but which may indicate a segmental enteritis in the  current clinical context.    Note: Note that CT scanning at this site  utilizes multiple dose  reduction techniques including automatic exposure control, adjustment of  the MAA and/or KVP according to patient's size and use of iterative  reconstruction technique    Laurena Slimmer, MD   09/27/2018 9:16 PM      .    Medical Decision Making   I am the first provider for this patient.    I reviewed the vital signs, available nursing notes, past medical history, past surgical history, family history and social history.    Vital Signs-Reviewed the patient's vital signs.     Patient Vitals for the past 12 hrs:   BP Temp Pulse Resp   09/27/18 2147 99/62 98 F (36.7 C) 76 12   09/27/18 2000 (!) 88/60 - 83 -   09/27/18 1944 99/69 98 F (36.7 C) 80 12   09/27/18 1521 122/74 98.2 F (36.8 C) 98 18       Pulse Oximetry Analysis - Normal 98% on RA.      Old Medical Records: Nursing notes.     ED Course:     9:53 PM - Pt feeling better. Updated pt on all results. Discussed f/u with PCP, usage of Lomotil & Zofran, home self care, discharge instructions, and return precautions with patient. Possibility of evolving illness reviewed. All questions solicited and addressed. Patient states understanding and amenable to discharge.        Diagnosis     Clinical Impression:   1. Enteritis    2. Vomiting and diarrhea        Treatment Plan:   ED Disposition     ED Disposition Condition Date/Time Comment    Discharge  Sun Sep 27, 2018  9:50 PM Crystal Morrison discharge to home/self care.    Condition at disposition: Stable             _______________________________      Attestations: This note is prepared by Can Ozyildirim, acting as scribe for Audley Hose, MD.    Audley Hose, MD - The scribe's documentation has been prepared under my direction and personally reviewed by me in its entirety.  I confirm that the note above accurately reflects all work, treatment, procedures, and medical decision making performed by me.    _______________________________     Cherlyn Roberts, MD  10/04/18 [phone]

## 2018-09-27 NOTE — Discharge Instructions (Signed)
Vomiting / Diarrhea, Viral    You were seen today for vomiting and diarrhea.    In your case we believe your symptoms may have been caused by a virus.    Many people call this gastroenteritis or the stomach flu but neither of these terms are exact descriptions of what is happening in your gastrointestinal system. We will refer to your problem as vomiting and diarrhea.    This infection often causes:   Nausea (feeling sick to your stomach).   Vomiting (throwing up)   Diarrhea which sometimes may be bloody.    Abdominal (belly) pain and cramps.   Fever (temperature higher than 100.58F / 38C).     Your symptoms are treated with medicine to help the nausea and diarrhea.     Do the following at home:   Try not to eat anything for the next day.   Drink clear liquids, like water, broth, or juice that has been watered down.    Try not to eat large heavy meals that may make you sick to your stomach. When you start feeling better, you Crystal Morrison eat more.   It is important to get enough fluids when you have a stomach virus.    Follow up with your primary doctor if you do not improve or if you feel worse.    Though we don't believe your condition is serious right now, it is important to be careful. Sometimes a problem that seems mild Crystal Morrison become serious later. This is why it is very important that you return here or go to the nearest Emergency Department if you are not improving or your symptoms are getting worse.    YOU SHOULD SEEK MEDICAL ATTENTION IMMEDIATELY, EITHER HERE OR AT THE NEAREST EMERGENCY DEPARTMENT, IF ANY OF THE FOLLOWING HAPPENS:   You do not urinate (pee) at least once every 8 hours.   Your throw up more often or you cannot keep fluids down.   Your vomit is bloody or dark green or your stool (poop) is bloody.   You do not improve over 2 to 3 days.   You have any new symptoms or concerns.    If you Crystal Morrison't follow up with your doctor, or if at any time you feel you need to be rechecked or seen  again, come back here or go to the nearest emergency department.             Enteritis    You have been diagnosed with acute enteritis.    Enteritis is an inflammation of the stomach and small intestine. Food with bacteria, bacterial toxins or viruses is often the cause.    Enteritis symptoms include crampy abdominal (belly) pain. They sometimes include fever (temperature higher than 100.58F / 38C) and/or diarrhea. The diarrhea is sometimes bloody.    It is important to keep yourself hydrated. You should avoid alcoholic drinks, soda pop or other drinks with caffeine. These Crystal Morrison cause you to lose fluid and become dehydrated. You should also avoid drinks that contain a lot of sugar, like apple or pear juice, as this Crystal Morrison make your diarrhea worse. You should drink fluids like Gatorade or Pedialyte.    Acute enteritis is treated with medicine to help the diarrhea. Some patients need antibiotics. It might help to avoid eating for the next day. Instead, drink clear liquids, like water, broth, or juice that has been diluted (watered down). Avoid large heavy meals that may make you sick to your stomach. When you start  feeling better, you Crystal Morrison eat more. You will probably feel better in a few days, but it might take up to a week.    Ask the doctor before using antidiarrheal medicines. In some cases they Crystal Morrison be harmful.    Talk to your regular doctor if you do not feel better or you have new symptoms or concerns.     YOU SHOULD SEEK MEDICAL ATTENTION IMMEDIATELY FOR YOURSELF, EITHER HERE OR AT THE NEAREST EMERGENCY DEPARTMENT, IF ANY OF THE FOLLOWING OCCURS:   You show signs of dehydration. Symptoms of dehydration include weakness, lightheadedness or dizziness when standing, dry or sticky mouth and not urinating for six hours or more.   You cannot keep clear liquids down or you have vomit that is dark green or bloody.   You have bloody diarrhea, especially if you have already received treatment.   You have stomach  pain that gets worse or does not improve after 24 hours.   You notice a rash.   You do not feel better after treatment.   Your symptoms get worse at any time.    If you Crystal Morrison follow up with your child's doctor, or if at any time you feel you child needs to be rechecked or seen again, come back here or go to the nearest emergency department.

## 2018-09-27 NOTE — ED Triage Notes (Signed)
Patient presents to the ED with vomiting and diarrhea since yesterday, lower abdominal pain. Hot and cold flashes.

## 2019-08-01 ENCOUNTER — Emergency Department
Admission: EM | Admit: 2019-08-01 | Discharge: 2019-08-02 | Disposition: A | Discharge status: Home / Self Care | Payer: Medicaid Other | Attending: Emergency Medicine | Admitting: Emergency Medicine

## 2019-08-01 DIAGNOSIS — T7840XA Allergy, unspecified, initial encounter: Secondary | ICD-10-CM | POA: Insufficient documentation

## 2019-08-01 DIAGNOSIS — X58XXXA Exposure to other specified factors, initial encounter: Secondary | ICD-10-CM | POA: Insufficient documentation

## 2019-08-01 MED ORDER — DIPHENHYDRAMINE HCL 50 MG/ML IJ SOLN
50.00 mg | Freq: Once | INTRAMUSCULAR | Status: AC
Start: 2019-08-01 — End: 2019-08-01
  Administered 2019-08-01: 23:00:00 50 mg via INTRAVENOUS
  Filled 2019-08-01: qty 1

## 2019-08-01 MED ORDER — DIPHENHYDRAMINE HCL 25 MG PO CAPS
50.00 mg | ORAL_CAPSULE | Freq: Once | ORAL | Status: DC
Start: 2019-08-01 — End: 2019-08-01
  Filled 2019-08-01: qty 2

## 2019-08-01 MED ORDER — EPINEPHRINE HCL 1 MG/ML ADULT ANAPHYLAXIS KIT
0.3000 mg | Freq: Once | INTRAMUSCULAR | Status: AC
Start: 2019-08-01 — End: 2019-08-01
  Administered 2019-08-01: 22:00:00 0.3 mg via INTRAMUSCULAR
  Filled 2019-08-01: qty 1

## 2019-08-01 MED ORDER — PREDNISONE 20 MG PO TABS
60.0000 mg | ORAL_TABLET | Freq: Once | ORAL | Status: DC
Start: 2019-08-01 — End: 2019-08-01
  Filled 2019-08-01: qty 3

## 2019-08-01 MED ORDER — METHYLPREDNISOLONE SODIUM SUCC 125 MG IJ SOLR
125.00 mg | Freq: Once | INTRAMUSCULAR | Status: AC
Start: 2019-08-01 — End: 2019-08-01
  Administered 2019-08-01: 23:00:00 125 mg via INTRAVENOUS
  Filled 2019-08-01: qty 2

## 2019-08-01 MED ORDER — FAMOTIDINE 20 MG PO TABS
20.0000 mg | ORAL_TABLET | Freq: Once | ORAL | Status: DC
Start: 2019-08-01 — End: 2019-08-01
  Filled 2019-08-01: qty 1

## 2019-08-01 MED ORDER — FAMOTIDINE 10 MG/ML IV SOLN (WRAP)
20.00 mg | Freq: Once | INTRAVENOUS | Status: AC
Start: 2019-08-01 — End: 2019-08-01
  Administered 2019-08-01: 23:00:00 20 mg via INTRAVENOUS
  Filled 2019-08-01: qty 2

## 2019-08-01 NOTE — ED Provider Notes (Signed)
EMERGENCY DEPARTMENT HISTORY AND PHYSICAL EXAM     Physician/Midlevel provider first contact with patient: 08/01/19 2217         Date: 08/01/2019  Patient Name: Crystal Morrison    This note was generated by the Epic EMR system/ Dragon speech recognition and may contain inherent errors or omissions not intended by the user. Grammatical errors, random word insertions, deletions and pronoun errors  are occasional consequences of this technology due to software limitations. Not all errors are caught or corrected. If there are questions or concerns about the content of this note or information contained within the body of this dictation they should be addressed directly with the author for clarification.    History of Presenting Illness     Chief Complaint   Patient presents with    Allergic Reaction       History Provided By: Patient    Chief Complaint: Itching  Onset: 2 AM  Timing: Constant  Location: Diffuse  Quality: Uncomfortable  Radiation: none  Severity: Moderate to severe  Exacerbating factors: Symptoms started after eating at a restaurant  Alleviating factors: No relief with Benadryl  Associated Symptoms: Lip swelling, dyspnea, throat tightness  Pertinent Negatives: see ROS    Additional History: Crystal Morrison is a 28 y.o. female with no pertinent PMH presents with diffuse pruritus that started at 2 AM.  Patient reports associated throat tightness, dyspnea and lower lip swelling.  Symptoms began after patient ate at a restaurant tonight.  She does not recall any specific unusual foods and does not normally have food allergies.  Patient took Benadryl prior to arrival without improvement of symptoms.  She denies tongue swelling, urticaria, skin erythema, wheezing, stridor, CP, palpitations, dizziness, diaphoresis, nausea, vomiting, diarrhea, fevers, chills.  Patient has not used or been prescribed EpiPen in the past.  She does not take ACEI or ARB at home.    I reviewed patient's last ED visit, clinic  visit or admission/discharge summary, as well as associated recent EKGs, lab or imaging results, if applicable.     PCP: Pcp, None, MD    Past History   Past Medical History--reviewed, as per HPI  Past Surgical History--reviewed, ankle surgery, wrist surgery  Family History--reviewed, no pertinent history  Social History--reviewed, no pertinent history  Allergies reviewed and documented as pertinent to the case.     Review of Systems     Review of Systems   Constitutional: Negative for chills and fever.   HENT: Negative for congestion, sore throat and voice change.         + Lip swelling  + Throat tightness   Eyes: Negative for redness and visual disturbance.   Respiratory: Positive for shortness of breath. Negative for cough and chest tightness.    Cardiovascular: Negative for chest pain, palpitations and leg swelling.   Gastrointestinal: Negative for abdominal pain, nausea and vomiting.   Endocrine: Negative for polydipsia and polyphagia.   Genitourinary: Negative for dysuria, flank pain, frequency, urgency and vaginal bleeding.   Musculoskeletal: Negative for back pain, gait problem, neck pain and neck stiffness.   Skin: Negative for color change and rash.        + Itching   Allergic/Immunologic: Negative for immunocompromised state.   Neurological: Negative for dizziness, syncope, weakness, numbness and headaches.   Hematological: Negative for adenopathy.   Psychiatric/Behavioral: Negative for agitation and confusion. The patient is not nervous/anxious.    All other systems reviewed and are negative.  Other than pertinent positives as above, a complete ROS was reviewed and negative.    Physical Exam   BP (!) 139/102    Pulse 87    Temp 98.3 F (36.8 C) (Oral)    Resp 20    Ht 5' (1.524 m)    Wt 68.5 kg    SpO2 99%    BMI 29.49 kg/m     Physical Exam   Constitutional: She is oriented to person, place, and time. She appears well-developed and well-nourished.   Appears nontoxic but uncomfortable.   HENT:    Head: Normocephalic and atraumatic.   Mouth/Throat: Oropharynx is clear and moist.   Lower lip edema without tongue edema.  Posterior pharynx with normal architecture, uvula midline, no drooling.   Eyes: Pupils are equal, round, and reactive to light. Conjunctivae are normal.   Neck: Normal range of motion. Neck supple. No JVD present.   Cardiovascular: Normal rate, regular rhythm, normal heart sounds and intact distal pulses.   Regular pulses, palpable in all 4 extremities. No pedal edema.    Pulmonary/Chest: Effort normal and breath sounds normal. No respiratory distress.   Mild tachypnea with normal work of breathing, no respiratory distress.  No wheezing or stridor.   Abdominal: Soft. Bowel sounds are normal. She exhibits no distension. There is no abdominal tenderness.   Abdomen soft, NTND, NABS. No guarding, rebound or rigidity. No palpable masses or hernias.    Musculoskeletal: Normal range of motion.         General: No tenderness or edema.   Neurological: She is alert and oriented to person, place, and time. She exhibits normal muscle tone.   Skin: Skin is warm and dry. No rash noted. She is not diaphoretic. No erythema.   Skin without urticaria, erythema, rash or excoriations.  Patient actively scratching the skin during exam.   Psychiatric: She has a normal mood and affect. Her behavior is normal.   Nursing note and vitals reviewed.        Diagnostic Study Results     Labs -  Reviewed, if relevant to the case.    Radiologic Studies -   Reviewed, if relevant to the case.      Medical Decision Making   I am the first provider for this patient.    I reviewed the vital signs, available nursing notes, past medical history, past surgical history, family history and social history.    Vital Signs: Reviewed the patient's vital signs during ED stay.    I reviewed pt's pulse oxymetry and cardiac monitor values, as relevant to the case.    Pulse Oximetry Analysis: 99% on RA- Normal    Cardiac Monitor:  Rhythm:   Normal Sinus, Rate:  Normal, Ectopy:  None    Personal Protective Equipment (PPE)  Gloves, N95 and surgical mask.    Record Review: The following, if applicable to the case, were reviewed and noted:    Old medical records.  Nursing notes.  Outside records.     Critical Care Time:   CRITICAL CARE: The high probability of sudden, clinically significant deterioration in the patient's condition required the highest level of my preparedness to intervene urgently.    The services I provided to this patient were to treat and/or prevent clinically significant deterioration that could result in: death.  Services included the following: chart data review, reviewing nursing notes and/or old charts, documentation time, consultant collaboration regarding findings and treatment options, medication orders and management, direct patient care,  re-evaluations, vital sign assessments and ordering, interpreting and reviewing diagnostic studies/lab tests.    Aggregate critical care time was 37 minutes, which includes only time during which I was engaged in work directly related to the patient's care, as described above, whether at the bedside or elsewhere in the Emergency Department.  It did not include time spent performing other reported procedures or the services of residents, students, nurses or physician assistants.    ED Course:     2224: Pt agrees with plan for IM epi, benadryl/pepcid/prednisone, observation.     2315: Pt reassessed and states pruritus continues.  Oxygen saturation good on RA. Will cont to monitor and reassess.     1610: Patient reports feeling better.  Information reviewed, pt comfortable going home. Discussed home rx, return precautions, follow up, symptom control. Possibility of evolving illness reviewed. All questions solicited and addressed.  Pt understands and is eager for discharge.    Provider Notes/MDM:     28 y.o. female with sudden onset of diffuse pruritus, lip swelling, throat tightness and dyspnea  PTA.  Good oxygen saturation on arrival, but concern for early anaphylaxis.  In the ED patient received IM epinephrine, IV Benadryl/Pepcid/Solu-Medrol.  In the ED patient required close monitoring and multiple reassessments. She remained HDS and had good sats oxygen saturations throughout her ED stay.  Discussed information with patient and reassured. Pt is comfortable going home. Rx for EpiPen, Pepcid, prednisone, Benadryl, triamcinolone  cream provided. Included list of OTC meds for symptomatic control. Talked about symptoms that would prompt a return to the ED and patient expressed understanding. Pt discharged with allergist follow up in 10 days. Pt agrees with the plan and is comfortable with discharge from the ED.    Diagnosis     Clinical Impression:   1. Allergic reaction, initial encounter        Disposition:   ED Disposition     ED Disposition Condition Date/Time Comment    Discharge  Mon Aug 02, 2019 12:43 AM Angela Burke discharge to home/self care.    Condition at disposition: Stable          The above diagnostic process was due to medical necessity based on risk stratification of potential harm of patient's presenting complaint.     Attestations: This note is prepared by Ardis Hughs, MD, PHD, FACEP.      Juliane Poot, MD PhD  08/02/19 2039

## 2019-08-01 NOTE — ED Triage Notes (Signed)
Crystal Morrison is a 28 y.o. female c/o allergic reaction that started yesterday after eating at a restaurant. Pt now reports increased itching, SOB and lower lip swelling. Pt took two benadryl at 2000. BP (!) 139/102    Pulse 87    Temp 98.3 F (36.8 C) (Oral)    Resp 20    Ht 5' (1.524 m)    Wt 68.5 kg    SpO2 99%    BMI 29.49 kg/m

## 2019-08-02 ENCOUNTER — Telehealth: Payer: Self-pay | Admitting: Emergency Medicine

## 2019-08-02 MED ORDER — FAMOTIDINE 20 MG PO TABS
20.00 mg | ORAL_TABLET | Freq: Two times a day (BID) | ORAL | 0 refills | Status: AC
Start: 2019-08-02 — End: ?

## 2019-08-02 MED ORDER — PREDNISONE 20 MG PO TABS
60.00 mg | ORAL_TABLET | Freq: Every day | ORAL | 0 refills | Status: AC
Start: 2019-08-02 — End: 2019-08-06

## 2019-08-02 MED ORDER — TRIAMCINOLONE ACETONIDE 0.1 % EX CREA
TOPICAL_CREAM | Freq: Two times a day (BID) | CUTANEOUS | 0 refills | Status: AC
Start: 2019-08-02 — End: ?

## 2019-08-02 MED ORDER — DIPHENHYDRAMINE HCL 25 MG PO TABS
25.00 mg | ORAL_TABLET | Freq: Four times a day (QID) | ORAL | 0 refills | Status: AC
Start: 2019-08-02 — End: ?

## 2019-08-02 MED ORDER — EPINEPHRINE 0.3 MG/0.3ML IJ SOAJ
0.30 mg | INTRAMUSCULAR | 0 refills | Status: AC | PRN
Start: 2019-08-02 — End: ?

## 2019-08-02 NOTE — Discharge Instructions (Signed)
Thank you for allowing us the privilege of taking care of you. Our goal is to provide you with EXCELLENT care. I hope we were able to accomplish that goal.    If there are any questions or concerns at all, we are always a resource for you.     You can contact us at the following numbers:  - [facility]: [phone]  - [facility]: [phone]    Please know that the diagnosis in the emergency department is often preliminary and a specific diagnosis cannot always be made. Every disease has a progression and may take time before it is recognized. It is very important to return to the ER or see you primary care doctor if you do not improve and especially if your symptoms worsen or change. IF YOU SENSE SOMETHING IS NOT RIGHT, DO NOT HESITATE TO RETURN TO THE ER.      Allergic Reaction     You have been seen for an allergic reaction.     An allergic reaction is when your body reacts to something it comes in contact with. This can be something you ate. It can also be something that got on your skin or that you breathed in. Insect bites sometimes cause this reaction. Wasp, hornet and bee stings often cause this reaction.     Allergic reactions can cause a few things to happen. Some people get hives (large raised welts). Others get blistered skin. More serious reactions include swelling of the lips and/or tongue. They also include difficulty breathing. This can be with or without wheezing.     Often, the exact cause of the allergic reaction is never found.     If you find you are allergic to something, avoid it. Future allergic reactions could get much worse.     General treatment for an allergic reaction includes antihistamines like diphenhydramine (Benadryl®) or prescription strength hydroxyzine (Atarax®) and steroids. Some antacids also act like antihistamines. These include famotidine (Pepcid®), ranitidine (Zantac®) or cimetidine (Tagamet®). These are often used to help with the allergic  reaction.     If you get a steroid prescription, it is important to finish the entire prescription. The allergic reaction can come back suddenly (rebound) if you suddenly stop the steroid too early.     YOU SHOULD SEEK MEDICAL ATTENTION IMMEDIATELY, EITHER HERE OR AT THE NEAREST EMERGENCY DEPARTMENT, IF ANY OF THE FOLLOWING OCCURS:  · Your lips or tongue get swollen.  · You have trouble breathing or start wheezing.  · Your rash seems to get infected. Signs of infection are skin redness, pain, pus, swelling or fever (temperature higher than 100.4ºF / 38ºC).

## 2019-08-02 NOTE — Telephone Encounter (Signed)
Left a message for patient as part of routine follow up. Encouraged pt to call hospital for any questions or return to ED for worsenings.

## 2019-08-02 NOTE — ED Notes (Signed)
Pt. Advised to call an uber/friend for ride home, MD aware and advised patient to do the same. Pt. Searched for ride however did not want to leave car at hospital.     Pt. Appears awake/alert, walking without difficulty. Vitals stable.

## 2019-11-26 ENCOUNTER — Encounter (HOSPITAL_COMMUNITY): Payer: Self-pay | Admitting: Emergency Medicine

## 2019-11-26 DIAGNOSIS — R5383 Other fatigue: Secondary | ICD-10-CM | POA: Insufficient documentation

## 2019-11-26 DIAGNOSIS — Z20822 Contact with and (suspected) exposure to covid-19: Secondary | ICD-10-CM | POA: Diagnosis not present

## 2019-11-26 DIAGNOSIS — R0602 Shortness of breath: Secondary | ICD-10-CM | POA: Insufficient documentation

## 2019-11-26 DIAGNOSIS — B349 Viral infection, unspecified: Secondary | ICD-10-CM | POA: Diagnosis not present

## 2019-11-26 DIAGNOSIS — R0789 Other chest pain: Secondary | ICD-10-CM | POA: Diagnosis present

## 2019-11-26 MED ORDER — SODIUM CHLORIDE 0.9% FLUSH: 3.0000 mL | Freq: Once | INTRAVENOUS | Status: DC

## 2019-11-27 ENCOUNTER — Emergency Department (HOSPITAL_COMMUNITY): Payer: Medicaid - Out of State

## 2019-11-27 ENCOUNTER — Emergency Department (HOSPITAL_COMMUNITY)
Admission: EM | Admit: 2019-11-27 | Discharge: 2019-11-27 | Disposition: A | Discharge status: Home / Self Care | Payer: Medicaid - Out of State | Attending: Emergency Medicine | Admitting: Emergency Medicine

## 2019-11-27 ENCOUNTER — Other Ambulatory Visit: Payer: Self-pay

## 2019-11-27 ENCOUNTER — Encounter (HOSPITAL_COMMUNITY): Payer: Self-pay | Admitting: Emergency Medicine

## 2019-11-27 DIAGNOSIS — B349 Viral infection, unspecified: Secondary | ICD-10-CM

## 2019-11-27 DIAGNOSIS — Z20822 Contact with and (suspected) exposure to covid-19: Secondary | ICD-10-CM

## 2019-11-27 LAB — BASIC METABOLIC PANEL
Anion gap: 6 (ref 5–15)
BUN: 14 mg/dL (ref 6–20)
CO2: 29 mmol/L (ref 22–32)
Calcium: 9.1 mg/dL (ref 8.9–10.3)
Chloride: 106 mmol/L (ref 98–111)
Creatinine, Ser: 0.87 mg/dL (ref 0.44–1.00)
GFR calc Af Amer: >60 mL/min (ref >60)
GFR calc non Af Amer: >60 mL/min (ref >60)
Glucose, Bld: 110 mg/dL — ABNORMAL HIGH (ref 70–99)
Potassium: 4.1 mmol/L (ref 3.5–5.1)
Sodium: 141 mmol/L (ref 135–145)

## 2019-11-27 LAB — URINALYSIS, ROUTINE W REFLEX MICROSCOPIC
Glucose, UA: NEGATIVE mg/dL
Hgb urine dipstick: NEGATIVE
Ketones, ur: 5 mg/dL — AB
Nitrite: NEGATIVE
Protein, ur: 30 mg/dL — AB
Specific Gravity, Urine: 1.034 — ABNORMAL HIGH (ref 1.005–1.030)
pH: 7 (ref 5.0–8.0)

## 2019-11-27 LAB — CBC
HCT: 40.3 % (ref 36.0–46.0)
Hemoglobin: 13.4 g/dL (ref 12.0–15.0)
MCH: 31.2 pg (ref 26.0–34.0)
MCHC: 33.3 g/dL (ref 30.0–36.0)
MCV: 93.9 fL (ref 80.0–100.0)
Platelets: 274 10*3/uL (ref 150–400)
RBC: 4.29 MIL/uL (ref 3.87–5.11)
RDW: 12.9 % (ref 11.5–15.5)
WBC: 5.1 10*3/uL (ref 4.0–10.5)
nRBC: 0 % (ref 0.0–0.2)

## 2019-11-27 LAB — I-STAT BETA HCG BLOOD, ED (NOT ORDERABLE): I-stat hCG, quantitative: <5 m[IU]/mL (ref <5)

## 2019-11-27 LAB — TROPONIN I (HIGH SENSITIVITY): Troponin I (High Sensitivity): <2 ng/L (ref <18)

## 2019-11-27 MED ORDER — NAPROXEN 500 MG PO TABS
500.0000 mg | ORAL_TABLET | Freq: Once | ORAL | Status: AC
  Administered 2019-11-27: 01:00:00 500 mg via ORAL
  Filled 2019-11-27: qty 1

## 2019-11-27 NOTE — Discharge Instructions (Addendum)
We recommend alternating Tylenol and ibuprofen for symptom management.  Get plenty of rest and drink plenty of fluids to prevent dehydration.  You do have a Covid test pending which should result in the next 48 hours.  You can follow-up on these results through MyChart, but should receive a call in the setting of a positive test.  Follow-up with a primary care doctor to ensure resolution of symptoms.

## 2019-11-27 NOTE — ED Triage Notes (Signed)
Patient is complaining of mid chest tightness, chills, and not feeling good. Patient states this started on Monday after she got her STD medications on Sunday. Patient is still on medication for bacterial vaginosis.

## 2019-11-27 NOTE — ED Provider Notes (Signed)
[facility] DEPT Provider Note   CSN: [id number] Arrival date & time: 11/26/19  2345     History Chief Complaint  Patient presents with  . Chest Pain  . Shortness of Breath    Marissa Hull is a 29 y.o. female.   29 year old female presents to the emergency department for evaluation of multiple vague complaints.  She states that she has been experiencing general fatigue over the past few days with intermittent chills.  She reports episodes of shortness of breath, exacerbated by exertion.  Further notes mild mid chest tightness which is also intermittent.  She has not taken any medications for her symptoms and denies sick contacts.  Initially thought that her symptoms were secondary to recent treatment for STDs.  She is currently finishing a course of Flagyl.  She has not had any fevers, vomiting, diarrhea, abdominal pain.   The history is provided by the patient. No language interpreter was used.       History reviewed. No pertinent past medical history.  There are no problems to display for this patient.   History reviewed. No pertinent surgical history.   OB History    Gravida  1   Para      Term      Preterm      AB      Living        SAB      TAB      Ectopic      Multiple      Live Births              History reviewed. No pertinent family history.  Social History   Tobacco Use  . Smoking status: Never Smoker  . Smokeless tobacco: Never Used  Substance Use Topics  . Alcohol use: No  . Drug use: Never    Home Medications Prior to Admission medications   Medication Sig Start Date End Date Taking? Authorizing Provider  metroNIDAZOLE (FLAGYL) 500 MG tablet Take 500 mg by mouth 2 (two) times daily. 11/21/19  Yes [provider]    Allergies    Patient has no known allergies.  Review of Systems   Review of Systems  Ten systems reviewed and are negative for acute change, except as noted in the HPI.     Physical Exam Updated Vital Signs BP 115/72 (BP Location: Left Arm)   Pulse 76   Temp 98.2 F (36.8 C)   Resp 16   Ht 5' (1.524 m)   Wt 68 kg   LMP 11/04/2019   SpO2 100%   Breastfeeding Unknown   BMI 29.29 kg/m   Physical Exam Vitals and nursing note reviewed.  Constitutional:      General: She is not in acute distress.    Appearance: She is well-developed. She is not diaphoretic.     Comments: Nontoxic appearing and in NAD  HENT:     Head: Normocephalic and atraumatic.  Eyes:     General: No scleral icterus.    Conjunctiva/sclera: Conjunctivae normal.  Cardiovascular:     Rate and Rhythm: Normal rate and regular rhythm.     Pulses: Normal pulses.  Pulmonary:     Effort: Pulmonary effort is normal. No respiratory distress.     Breath sounds: No stridor.     Comments: Respirations even and unlabored Musculoskeletal:        General: Normal range of motion.     Cervical back: Normal range of motion.  Skin:  General: Skin is warm and dry.     Coloration: Skin is not pale.     Findings: No erythema or rash.  Neurological:     General: No focal deficit present.     Mental Status: She is alert and oriented to person, place, and time.     Coordination: Coordination normal.     Comments: GCS 15. Patient moving all extremities spontaneously.  Psychiatric:        Behavior: Behavior normal.     ED Results / Procedures / Treatments   Labs (all labs ordered are listed, but only abnormal results are displayed) Labs Reviewed  BASIC METABOLIC PANEL - Abnormal; Notable for the following components:      Result Value   Glucose, Bld 110 (*)    All other components within normal limits  URINALYSIS, ROUTINE W REFLEX MICROSCOPIC - Abnormal; Notable for the following components:   Color, Urine AMBER (*)    Specific Gravity, Urine 1.034 (*)    Bilirubin Urine SMALL (*)    Ketones, ur 5 (*)    Protein, ur 30 (*)    Leukocytes,Ua SMALL (*)    Bacteria, UA MANY (*)     All other components within normal limits  URINE CULTURE  NOVEL CORONAVIRUS, NAA (HOSP ORDER, SEND-OUT TO REF LAB; TAT 18-24 HRS)  CBC  I-STAT BETA HCG BLOOD, ED (NOT ORDERABLE)  TROPONIN I (HIGH SENSITIVITY)    EKG EKG Interpretation  Date/Time:  Saturday November 27 2019 00:03:10 EST Ventricular Rate:  68 PR Interval:    QRS Duration: 92 QT Interval:  392 QTC Calculation: 417 R Axis:   94 Text Interpretation: Sinus rhythm Borderline right axis deviation Baseline wander in lead(s) II III aVF V4 No old tracing to compare Confirmed by Meridee Score [phone]) on 11/27/2019 12:13:03 AM   Radiology DG Chest 2 View  Result Date: 11/27/2019 CLINICAL DATA:  Chest pain EXAM: CHEST - 2 VIEW COMPARISON:  None FINDINGS: The heart size and mediastinal contours are within normal limits. Both lungs are clear. The visualized skeletal structures are unremarkable. IMPRESSION: No active cardiopulmonary disease. Electronically Signed   By: Alcide Clever M.D.   On: 11/27/2019 00:38    Procedures Procedures (including critical care time)  Medications Ordered in ED Medications  sodium chloride flush (NS) 0.9 % injection 3 mL (has no administration in time range)  naproxen (NAPROSYN) tablet 500 mg (500 mg Oral Given 11/27/19 0120)    ED Course  I have reviewed the triage vital signs and the nursing notes.  Pertinent labs & imaging results that were available during my care of the patient were reviewed by me and considered in my medical decision making (see chart for details).  2:03 AM UA c/w bacteriuria, likely due to contaminated sample. Will add urine culture. Plan for discharge after outpatient COVID swab completed.     MDM Rules/Calculators/A&P                      Patient's symptoms are consistent with likely viral etiology. Discussed that antibiotics are not indicated for viral infections. Patient will be discharged with symptomatic treatment.  She verbalizes understanding and is  agreeable with plan.  Patient does have a Covid test pending.  She has been informed to quarantine until these results return within 48 hours.  Return precautions discussed and provided. Patient discharged in stable condition with no unaddressed concerns.  Marissa Hull was evaluated in Emergency Department on 11/27/2019 for the symptoms  described in the history of present illness. She was evaluated in the context of the global COVID-19 pandemic, which necessitated consideration that the patient might be at risk for infection with the SARS-CoV-2 virus that causes COVID-19. Institutional protocols and algorithms that pertain to the evaluation of patients at risk for COVID-19 are in a state of rapid change based on information released by regulatory bodies including the CDC and federal and state organizations. These policies and algorithms were followed during the patient's care in the ED.   Final Clinical Impression(s) / ED Diagnoses Final diagnoses:  Viral illness  Suspected COVID-19 virus infection    Rx / DC Orders ED Discharge Orders    None       Antony Madura, PA-C 11/27/19 0217    Zadie Rhine, MD 11/27/19 [phone]

## 2019-11-28 LAB — URINE CULTURE: Culture: <10000 — AB

## 2019-11-28 LAB — NOVEL CORONAVIRUS, NAA (HOSP ORDER, SEND-OUT TO REF LAB; TAT 18-24 HRS): SARS-CoV-2, NAA: NOT DETECTED

## 2020-03-23 DIAGNOSIS — B009 Herpesviral infection, unspecified: Secondary | ICD-10-CM | POA: Insufficient documentation

## 2020-10-19 ENCOUNTER — Encounter (HOSPITAL_COMMUNITY): Payer: Self-pay

## 2020-10-19 ENCOUNTER — Emergency Department (HOSPITAL_COMMUNITY)
Admission: EM | Admit: 2020-10-19 | Discharge: 2020-10-19 | Disposition: A | Discharge status: Home / Self Care | Payer: Medicaid - Out of State | Attending: Emergency Medicine | Admitting: Emergency Medicine

## 2020-10-19 DIAGNOSIS — Z5321 Procedure and treatment not carried out due to patient leaving prior to being seen by health care provider: Secondary | ICD-10-CM | POA: Insufficient documentation

## 2020-10-19 DIAGNOSIS — R1110 Vomiting, unspecified: Secondary | ICD-10-CM | POA: Insufficient documentation

## 2020-10-19 DIAGNOSIS — Z20822 Contact with and (suspected) exposure to covid-19: Secondary | ICD-10-CM | POA: Insufficient documentation

## 2020-10-19 DIAGNOSIS — R059 Cough, unspecified: Secondary | ICD-10-CM | POA: Insufficient documentation

## 2020-10-19 LAB — URINALYSIS, ROUTINE W REFLEX MICROSCOPIC
Bilirubin Urine: NEGATIVE
Glucose, UA: NEGATIVE mg/dL
Hgb urine dipstick: NEGATIVE
Ketones, ur: 20 mg/dL — AB
Nitrite: NEGATIVE
Protein, ur: 30 mg/dL — AB
Specific Gravity, Urine: 1.031 — ABNORMAL HIGH (ref 1.005–1.030)
pH: 6 (ref 5.0–8.0)

## 2020-10-19 LAB — COMPREHENSIVE METABOLIC PANEL
ALT: 15 U/L (ref 0–44)
AST: 20 U/L (ref 15–41)
Albumin: 3.8 g/dL (ref 3.5–5.0)
Alkaline Phosphatase: 38 U/L (ref 38–126)
Anion gap: 11 (ref 5–15)
BUN: 14 mg/dL (ref 6–20)
CO2: 25 mmol/L (ref 22–32)
Calcium: 9.3 mg/dL (ref 8.9–10.3)
Chloride: 102 mmol/L (ref 98–111)
Creatinine, Ser: 0.95 mg/dL (ref 0.44–1.00)
GFR, Estimated: >60 mL/min (ref >60)
Glucose, Bld: 103 mg/dL — ABNORMAL HIGH (ref 70–99)
Potassium: 4.4 mmol/L (ref 3.5–5.1)
Sodium: 138 mmol/L (ref 135–145)
Total Bilirubin: 0.6 mg/dL (ref 0.3–1.2)
Total Protein: 6.9 g/dL (ref 6.5–8.1)

## 2020-10-19 LAB — CBC
HCT: 38.5 % (ref 36.0–46.0)
Hemoglobin: 12.7 g/dL (ref 12.0–15.0)
MCH: 30.6 pg (ref 26.0–34.0)
MCHC: 33 g/dL (ref 30.0–36.0)
MCV: 92.8 fL (ref 80.0–100.0)
Platelets: 355 10*3/uL (ref 150–400)
RBC: 4.15 MIL/uL (ref 3.87–5.11)
RDW: 13.2 % (ref 11.5–15.5)
WBC: 7 10*3/uL (ref 4.0–10.5)
nRBC: 0 % (ref 0.0–0.2)

## 2020-10-19 LAB — I-STAT BETA HCG BLOOD, ED (MC, WL, AP ONLY): I-stat hCG, quantitative: <5 m[IU]/mL (ref <5)

## 2020-10-19 LAB — RESP PANEL BY RT-PCR (FLU A&B, COVID) ARPGX2
Influenza A by PCR: NEGATIVE
Influenza B by PCR: NEGATIVE
SARS Coronavirus 2 by RT PCR: NEGATIVE

## 2020-10-19 LAB — LIPASE, BLOOD: Lipase: 39 U/L (ref 11–51)

## 2020-10-19 MED ORDER — ONDANSETRON 4 MG PO TBDP
4.0000 mg | ORAL_TABLET | Freq: Once | ORAL | Status: AC
  Administered 2020-10-19: 4 mg via ORAL
  Filled 2020-10-19: qty 1

## 2020-10-19 MED ORDER — ONDANSETRON 4 MG PO TBDP
ORAL_TABLET | ORAL | Status: AC
  Filled 2020-10-19: qty 1

## 2020-10-19 NOTE — ED Notes (Signed)
Pt stated was going to leave due to having to pickup children and that this was too long of a wait. This NT notified pt that pt was up next, pt still refused and gave this NT pt labels and witness leaving.

## 2020-10-19 NOTE — ED Triage Notes (Signed)
Pt reports x1 week cough with green sputum. Pt reports it is hard to keep anything down. Pt reports vomiting since 8pm. Pt denies any pain . Pt reports she is unable to keep fluids down.

## 2020-10-19 NOTE — ED Notes (Signed)
Called pt X3 no answer 

## 2021-02-13 DIAGNOSIS — F329 Major depressive disorder, single episode, unspecified: Secondary | ICD-10-CM | POA: Insufficient documentation

## 2021-02-14 DIAGNOSIS — G4700 Insomnia, unspecified: Secondary | ICD-10-CM | POA: Insufficient documentation

## 2021-03-26 ENCOUNTER — Observation Stay (HOSPITAL_COMMUNITY): Payer: 59

## 2021-03-26 ENCOUNTER — Other Ambulatory Visit: Payer: Self-pay

## 2021-03-26 ENCOUNTER — Observation Stay (HOSPITAL_COMMUNITY)
Admission: EM | Admit: 2021-03-26 | Discharge: 2021-03-27 | Disposition: A | Discharge status: Home / Self Care | Payer: 59 | Attending: Family Medicine | Admitting: Family Medicine

## 2021-03-26 ENCOUNTER — Encounter (HOSPITAL_COMMUNITY): Payer: Self-pay | Admitting: Emergency Medicine

## 2021-03-26 ENCOUNTER — Emergency Department (HOSPITAL_COMMUNITY): Payer: 59

## 2021-03-26 ENCOUNTER — Other Ambulatory Visit (HOSPITAL_COMMUNITY): Payer: 59

## 2021-03-26 ENCOUNTER — Observation Stay (HOSPITAL_BASED_OUTPATIENT_CLINIC_OR_DEPARTMENT_OTHER): Payer: 59

## 2021-03-26 DIAGNOSIS — Z20822 Contact with and (suspected) exposure to covid-19: Secondary | ICD-10-CM | POA: Diagnosis not present

## 2021-03-26 DIAGNOSIS — Y900 Blood alcohol level of less than 20 mg/100 ml: Secondary | ICD-10-CM | POA: Diagnosis not present

## 2021-03-26 DIAGNOSIS — G459 Transient cerebral ischemic attack, unspecified: Secondary | ICD-10-CM | POA: Diagnosis not present

## 2021-03-26 DIAGNOSIS — R531 Weakness: Secondary | ICD-10-CM

## 2021-03-26 DIAGNOSIS — H53131 Sudden visual loss, right eye: Secondary | ICD-10-CM | POA: Diagnosis present

## 2021-03-26 LAB — LIPID PANEL
Cholesterol: 276 mg/dL — ABNORMAL HIGH (ref 0–200)
HDL: 82 mg/dL (ref >40)
LDL Cholesterol: 167 mg/dL — ABNORMAL HIGH (ref 0–99)
Total CHOL/HDL Ratio: 3.4 RATIO
Triglycerides: 137 mg/dL (ref <150)
VLDL: 27 mg/dL (ref 0–40)

## 2021-03-26 LAB — URINALYSIS, ROUTINE W REFLEX MICROSCOPIC
Bilirubin Urine: NEGATIVE
Glucose, UA: NEGATIVE mg/dL
Hgb urine dipstick: NEGATIVE
Ketones, ur: 5 mg/dL — AB
Leukocytes,Ua: NEGATIVE
Nitrite: NEGATIVE
Protein, ur: NEGATIVE mg/dL
Specific Gravity, Urine: 1.031 — ABNORMAL HIGH (ref 1.005–1.030)
pH: 6 (ref 5.0–8.0)

## 2021-03-26 LAB — ECHOCARDIOGRAM COMPLETE
Area-P 1/2: 2.66 cm2
Calc EF: 66.1 %
Height: 60 in
S' Lateral: 2.6 cm
Single Plane A2C EF: 63.5 %
Single Plane A4C EF: 66.7 %
Weight: 2208 oz

## 2021-03-26 LAB — COMPREHENSIVE METABOLIC PANEL
ALT: 12 U/L (ref 0–44)
AST: 17 U/L (ref 15–41)
Albumin: 4.4 g/dL (ref 3.5–5.0)
Alkaline Phosphatase: 40 U/L (ref 38–126)
Anion gap: 8 (ref 5–15)
BUN: 13 mg/dL (ref 6–20)
CO2: 28 mmol/L (ref 22–32)
Calcium: 9.7 mg/dL (ref 8.9–10.3)
Chloride: 101 mmol/L (ref 98–111)
Creatinine, Ser: 0.8 mg/dL (ref 0.44–1.00)
GFR, Estimated: >60 mL/min (ref >60)
Glucose, Bld: 97 mg/dL (ref 70–99)
Potassium: 3.4 mmol/L — ABNORMAL LOW (ref 3.5–5.1)
Sodium: 137 mmol/L (ref 135–145)
Total Bilirubin: 0.4 mg/dL (ref 0.3–1.2)
Total Protein: 8 g/dL (ref 6.5–8.1)

## 2021-03-26 LAB — DIFFERENTIAL
Abs Immature Granulocytes: 0.03 10*3/uL (ref 0.00–0.07)
Basophils Absolute: 0 10*3/uL (ref 0.0–0.1)
Basophils Relative: 0 %
Eosinophils Absolute: 0.2 10*3/uL (ref 0.0–0.5)
Eosinophils Relative: 3 %
Immature Granulocytes: 1 %
Lymphocytes Relative: 50 %
Lymphs Abs: 3.3 10*3/uL (ref 0.7–4.0)
Monocytes Absolute: 0.4 10*3/uL (ref 0.1–1.0)
Monocytes Relative: 7 %
Neutro Abs: 2.6 10*3/uL (ref 1.7–7.7)
Neutrophils Relative %: 39 %

## 2021-03-26 LAB — I-STAT CHEM 8, ED
BUN: 11 mg/dL (ref 6–20)
Calcium, Ion: 1.27 mmol/L (ref 1.15–1.40)
Chloride: 101 mmol/L (ref 98–111)
Creatinine, Ser: 0.8 mg/dL (ref 0.44–1.00)
Glucose, Bld: 90 mg/dL (ref 70–99)
HCT: 40 % (ref 36.0–46.0)
Hemoglobin: 13.6 g/dL (ref 12.0–15.0)
Potassium: 3.5 mmol/L (ref 3.5–5.1)
Sodium: 140 mmol/L (ref 135–145)
TCO2: 27 mmol/L (ref 22–32)

## 2021-03-26 LAB — RESP PANEL BY RT-PCR (FLU A&B, COVID) ARPGX2
Influenza A by PCR: NEGATIVE
Influenza B by PCR: NEGATIVE
SARS Coronavirus 2 by RT PCR: NEGATIVE

## 2021-03-26 LAB — CBC
HCT: 39.8 % (ref 36.0–46.0)
HCT: 35.4 % — ABNORMAL LOW (ref 36.0–46.0)
Hemoglobin: 13.4 g/dL (ref 12.0–15.0)
Hemoglobin: 12.1 g/dL (ref 12.0–15.0)
MCH: 30.7 pg (ref 26.0–34.0)
MCH: 31 pg (ref 26.0–34.0)
MCHC: 33.7 g/dL (ref 30.0–36.0)
MCHC: 34.2 g/dL (ref 30.0–36.0)
MCV: 91.1 fL (ref 80.0–100.0)
MCV: 90.8 fL (ref 80.0–100.0)
Platelets: 288 10*3/uL (ref 150–400)
Platelets: 249 10*3/uL (ref 150–400)
RBC: 4.37 MIL/uL (ref 3.87–5.11)
RBC: 3.9 MIL/uL (ref 3.87–5.11)
RDW: 12.8 % (ref 11.5–15.5)
RDW: 12.9 % (ref 11.5–15.5)
WBC: 6.5 10*3/uL (ref 4.0–10.5)
WBC: 5.5 10*3/uL (ref 4.0–10.5)
nRBC: 0 % (ref 0.0–0.2)
nRBC: 0 % (ref 0.0–0.2)

## 2021-03-26 LAB — PROTIME-INR
INR: 1.1 (ref 0.8–1.2)
Prothrombin Time: 14.1 seconds (ref 11.4–15.2)

## 2021-03-26 LAB — RAPID URINE DRUG SCREEN, HOSP PERFORMED
Amphetamines: NOT DETECTED
Barbiturates: NOT DETECTED
Benzodiazepines: NOT DETECTED
Cocaine: NOT DETECTED
Opiates: NOT DETECTED
Tetrahydrocannabinol: NOT DETECTED

## 2021-03-26 LAB — HEMOGLOBIN A1C
Hgb A1c MFr Bld: 5.6 % (ref 4.8–5.6)
Mean Plasma Glucose: 114.02 mg/dL

## 2021-03-26 LAB — CBG MONITORING, ED: Glucose-Capillary: 96 mg/dL (ref 70–99)

## 2021-03-26 LAB — ETHANOL: Alcohol, Ethyl (B): <10 mg/dL (ref <10)

## 2021-03-26 LAB — CREATININE, SERUM
Creatinine, Ser: 0.72 mg/dL (ref 0.44–1.00)
GFR, Estimated: >60 mL/min (ref >60)

## 2021-03-26 LAB — HCG, QUANTITATIVE, PREGNANCY: hCG, Beta Chain, Quant, S: 1 m[IU]/mL (ref <5)

## 2021-03-26 LAB — APTT: aPTT: 26 seconds (ref 24–36)

## 2021-03-26 LAB — HIV ANTIBODY (ROUTINE TESTING W REFLEX): HIV Screen 4th Generation wRfx: NONREACTIVE

## 2021-03-26 MED ORDER — ASPIRIN 325 MG PO TABS: 325.0000 mg | ORAL_TABLET | Freq: Every day | ORAL | Status: DC

## 2021-03-26 MED ORDER — ASPIRIN 300 MG RE SUPP: 300.0000 mg | Freq: Every day | RECTAL | Status: DC

## 2021-03-26 MED ORDER — ACETAMINOPHEN 650 MG RE SUPP: 650.0000 mg | RECTAL | Status: DC | PRN

## 2021-03-26 MED ORDER — SODIUM CHLORIDE 0.9 % IV SOLN: INTRAVENOUS | Status: AC

## 2021-03-26 MED ORDER — ASPIRIN EC 81 MG PO TBEC
81.0000 mg | DELAYED_RELEASE_TABLET | Freq: Every day | ORAL | Status: DC
  Administered 2021-03-26 – 2021-03-27 (×2): 81 mg via ORAL
  Filled 2021-03-26 (×2): qty 1

## 2021-03-26 MED ORDER — ENOXAPARIN SODIUM 40 MG/0.4ML IJ SOSY
40.0000 mg | PREFILLED_SYRINGE | INTRAMUSCULAR | Status: DC
  Administered 2021-03-27: 40 mg via SUBCUTANEOUS
  Filled 2021-03-26 (×2): qty 0.4

## 2021-03-26 MED ORDER — ATORVASTATIN CALCIUM 40 MG PO TABS
40.0000 mg | ORAL_TABLET | Freq: Every day | ORAL | Status: DC
  Administered 2021-03-26 – 2021-03-27 (×2): 40 mg via ORAL
  Filled 2021-03-26 (×2): qty 1

## 2021-03-26 MED ORDER — ACETAMINOPHEN 160 MG/5ML PO SOLN: 650.0000 mg | ORAL | Status: DC | PRN

## 2021-03-26 MED ORDER — STROKE: EARLY STAGES OF RECOVERY BOOK
Freq: Once | Status: AC
  Filled 2021-03-26: qty 1

## 2021-03-26 MED ORDER — ACETAMINOPHEN 325 MG PO TABS
650.0000 mg | ORAL_TABLET | ORAL | Status: DC | PRN
  Administered 2021-03-26 – 2021-03-27 (×3): 650 mg via ORAL
  Filled 2021-03-26 (×3): qty 2

## 2021-03-26 NOTE — ED Notes (Signed)
Spoke with MRI on phone. Verified Pt is not claustrophobic.

## 2021-03-26 NOTE — ED Notes (Signed)
[facility] at bedside. Patient being transported to [facility].

## 2021-03-26 NOTE — ED Triage Notes (Addendum)
Patient arrives complaining of right arm numbness/weakness, with loss of vision that has partially resolved that began around 0000-0030. Patient states she had just finished blow drying her hair when she lost vision in her right eye. Patient states she was unable to pick up objects with her right hand. Some weakness noted to right arm. Patient states vision is, "a little blurry" on the right. Patient states when she attempted to call her mother, she was unable to properly speak. Patient is able to speak in full sentences now.

## 2021-03-26 NOTE — Evaluation (Signed)
Occupational Therapy Evaluation Patient Details Name: Marissa Hull MRN: [id number] DOB: May 02, 1991 Today's Date: 03/26/2021    History of Present Illness Marissa Hull is a 30 y.o. female with no significant past medical history in the experiencing frontal headache, right UE weakness, vision loss in right eye. CT, MRI and MRA negative.   Clinical Impression   Patient evaluated by Occupational Therapy with no further acute OT needs identified. All education has been completed and the patient has no further questions.  See below for any follow-up Occupational Therapy or equipment needs. Pt may benefit from outpatient OT for continued RUE rehab if RUE does not continue to resolve with daily functional use. OT is signing off. Thank you for this referral.  ?     Follow Up Recommendations  Outpatient OT (If pt's RUE does not continue to resolve on it's own with functional use.)    Equipment Recommendations  None recommended by OT    Recommendations for Other Services       Precautions / Restrictions Precautions Precautions: None      Mobility Bed Mobility Overal bed mobility: Independent                  Transfers Overall transfer level: Independent                    Balance Overall balance assessment: Mild deficits observed, not formally tested;Modified Independent;Independent                                         ADL either performed or assessed with clinical judgement   ADL Overall ADL's : At baseline                                       General ADL Comments: Pt able to demonstrate sitting and standing LE dressing skills, good bed mobility and functional mobility in standing. In ED, so full ADL assessment limited.     Vision Baseline Vision/History: No visual deficits Patient Visual Report: No change from baseline Vision Assessment?: No apparent visual deficits Additional Comments: Intact pursuits. Pt denied any  residual changes to vision.     Perception     Praxis      Pertinent Vitals/Pain Pain Assessment: 0-10 Pain Score: 0-No pain Faces Pain Scale: Hurts little more Pain Location: Pt reports that she had tylenol and headache has resolved. Pain Descriptors / Indicators: Aching Pain Intervention(s): Monitored during session     Hand Dominance Right   Extremity/Trunk Assessment Upper Extremity Assessment Upper Extremity Assessment: RUE deficits/detail;LUE deficits/detail RUE Deficits / Details: AROM WFL but asymmetrical with LT UE and has lag with shoulder flexion.  MMT: shoulder 4-/5, elbow (tricep) 3+/5, bicep WFL, wrist: 4-/5, grip: 4-/5 RUE Sensation: decreased light touch (proprioception and light touch intact, although pt reports RT  hand feels diminished in comparison to LT hand.) RUE Coordination: WNL (Intact finger tothumb, finger to nose, and rapid alteranating sup/pro.) LUE Deficits / Details: AROM WNL and MMT: grossly 4/5 to 5/5 LUE Sensation: WNL LUE Coordination: WNL   Lower Extremity Assessment Lower Extremity Assessment: Overall WFL for tasks assessed   Cervical / Trunk Assessment Cervical / Trunk Assessment: Normal   Communication Communication Communication: No difficulties   Cognition Arousal/Alertness: Awake/alert Behavior During Therapy: WFL for tasks assessed/performed Overall  Cognitive Status: Within Functional Limits for tasks assessed                                     General Comments       Exercises Other Exercises Other Exercises: Pt encouraged to use RT UE ad lib during ADLs and all functional tasks in order to improve strength and coordination.   Shoulder Instructions      Home Living Family/patient expects to be discharged to:: Private residence Living Arrangements: Children Available Help at Discharge: Friend(s) (Pt kids are 5 and 7 but states there are friends who could help her if needed.) Type of Home: Apartment Home  Access: Level entry     Home Layout: One level     Bathroom Shower/Tub: Chief Strategy Officer: Standard     Home Equipment: None   Additional Comments: has 7 and 32 YO.      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        OT Problem List: Decreased strength;Decreased coordination;Impaired sensation      OT Treatment/Interventions:      OT Goals(Current goals can be found in the care plan section) Acute Rehab OT Goals Patient Stated Goal: Regain full use of RUE OT Goal Formulation: With patient Potential to Achieve Goals: Good  OT Frequency:     Barriers to D/C:            Co-evaluation              AM-PAC OT "6 Clicks" Daily Activity     Outcome Measure Help from another person eating meals?: None Help from another person taking care of personal grooming?: None Help from another person toileting, which includes using toliet, bedpan, or urinal?: None Help from another person bathing (including washing, rinsing, drying)?: None Help from another person to put on and taking off regular upper body clothing?: None Help from another person to put on and taking off regular lower body clothing?: None 6 Click Score: 24   End of Session Nurse Communication: Mobility status  Activity Tolerance: Patient tolerated treatment well Patient left: in bed                   Time: 6063-0160 OT Time Calculation (min): 17 min Charges:  OT General Charges $OT Visit: 1 Visit OT Evaluation $OT Eval Low Complexity: 1 Low  [place], OT Acute Rehab Services Office: [phone] 03/26/2021  Theodoro Clock 03/26/2021, 12:37 PM

## 2021-03-26 NOTE — ED Provider Notes (Signed)
[facility]-EMERGENCY DEPT Provider Note   CSN: [id number] Arrival date & time: 03/26/21  4098  An emergency department physician performed an initial assessment on this suspected stroke patient at 0228.  History Chief Complaint  Patient presents with  . Loss of Vision  . Extremity Weakness    Marissa Hull is a 30 y.o. female.  Patient presents to the emergency department for evaluation of sudden onset loss of right-sided vision, inability to speak and right arm weakness around midnight.  Patient reports that fairly rapidly her vision returned to normal and she is now able to speak.  She still is feeling weak in her right arm.  While she was on her way to the emergency department, she developed a headache.  She reports a history of recurrent headaches related to stress but has never been told she has migraines.        History reviewed. No pertinent past medical history.  There are no problems to display for this patient.   History reviewed. No pertinent surgical history.   OB History    Gravida  1   Para      Term      Preterm      AB      Living        SAB      IAB      Ectopic      Multiple      Live Births              No family history on file.  Social History   Tobacco Use  . Smoking status: Never Smoker  . Smokeless tobacco: Never Used  Vaping Use  . Vaping Use: Never used  Substance Use Topics  . Alcohol use: No  . Drug use: Never    Home Medications Prior to Admission medications   Medication Sig Start Date End Date Taking? Authorizing Provider  metroNIDAZOLE (FLAGYL) 500 MG tablet Take 500 mg by mouth 2 (two) times daily. 11/21/19   [provider]    Allergies    Patient has no known allergies.  Review of Systems   Review of Systems  Eyes: Positive for visual disturbance.  Neurological: Positive for speech difficulty, weakness, numbness and headaches.  All other systems reviewed and are  negative.   Physical Exam Updated Vital Signs BP (!) 139/97   Pulse 75   Temp 98.9 F (37.2 C) (Oral)   Resp 15   Ht 5' (1.524 m)   Wt 62.6 kg   SpO2 100%   BMI 26.95 kg/m   Physical Exam Vitals and nursing note reviewed.  Constitutional:      General: She is not in acute distress.    Appearance: Normal appearance. She is well-developed.  HENT:     Head: Normocephalic and atraumatic.     Right Ear: Hearing normal.     Left Ear: Hearing normal.     Nose: Nose normal.  Eyes:     Conjunctiva/sclera: Conjunctivae normal.     Pupils: Pupils are equal, round, and reactive to light.  Cardiovascular:     Rate and Rhythm: Regular rhythm.     Heart sounds: S1 normal and S2 normal. No murmur heard. No friction rub. No gallop.   Pulmonary:     Effort: Pulmonary effort is normal. No respiratory distress.     Breath sounds: Normal breath sounds.  Chest:     Chest wall: No tenderness.  Abdominal:  General: Bowel sounds are normal.     Palpations: Abdomen is soft.     Tenderness: There is no abdominal tenderness. There is no guarding or rebound. Negative signs include Murphy's sign and McBurney's sign.     Hernia: No hernia is present.  Musculoskeletal:        General: Normal range of motion.     Cervical back: Normal range of motion and neck supple.  Skin:    General: Skin is warm and dry.     Findings: No rash.  Neurological:     Mental Status: She is alert and oriented to person, place, and time.     GCS: GCS eye subscore is 4. GCS verbal subscore is 5. GCS motor subscore is 6.     Cranial Nerves: No cranial nerve deficit.     Sensory: No sensory deficit.     Motor: Weakness (Right upper extremity) present.     Coordination: Coordination abnormal (Right upper extremity).  Psychiatric:        Speech: Speech normal.        Behavior: Behavior normal.        Thought Content: Thought content normal.     ED Results / Procedures / Treatments   Labs (all labs ordered  are listed, but only abnormal results are displayed) Labs Reviewed  RESP PANEL BY RT-PCR (FLU A&B, COVID) ARPGX2  ETHANOL  PROTIME-INR  APTT  CBC  DIFFERENTIAL  COMPREHENSIVE METABOLIC PANEL  RAPID URINE DRUG SCREEN, HOSP PERFORMED  URINALYSIS, ROUTINE W REFLEX MICROSCOPIC  HCG, QUANTITATIVE, PREGNANCY  I-STAT CHEM 8, ED  I-STAT BETA HCG BLOOD, ED ([facility], [facility], [facility] ONLY)  CBG MONITORING, ED    EKG EKG Interpretation  Date/Time:  Monday Mar 26 2021 02:31:45 EDT Ventricular Rate:  75 PR Interval:  156 QRS Duration: 81 QT Interval:  365 QTC Calculation: 408 R Axis:   84 Text Interpretation: Sinus rhythm Normal ECG Confirmed by Gilda Crease [phone]) on 03/26/2021 2:36:51 AM   Radiology CT HEAD CODE STROKE WO CONTRAST  Result Date: 03/26/2021 CLINICAL DATA:  Code stroke. Initial evaluation for acute right-sided numbness and weakness. EXAM: CT HEAD WITHOUT CONTRAST TECHNIQUE: Contiguous axial images were obtained from the base of the skull through the vertex without intravenous contrast. COMPARISON:  None. FINDINGS: Brain: Cerebral volume within normal limits. No acute intracranial hemorrhage. No acute large vessel territory infarct. No mass lesion, midline shift or mass effect. No hydrocephalus or extra-axial fluid collection. Vascular: No convincing hyperdense vessel. Skull: Scalp soft tissues and calvarium within normal limits. Sinuses/Orbits: Right gaze noted. Globes and orbital soft tissues otherwise unremarkable. Mild mucosal thickening noted within the sphenoid sinuses. Paranasal sinuses are otherwise clear. No mastoid effusion. Other: None. ASPECTS [facility] Stroke Program Early CT Score) - Ganglionic level infarction (caudate, lentiform nuclei, internal capsule, insula, M1-M3 cortex): 7 - Supraganglionic infarction (M4-M6 cortex): 3 Total score (0-10 with 10 being normal): 10 IMPRESSION: 1. Normal head CT.  No acute intracranial abnormality. 2. ASPECTS is 10. Critical  Value/emergent results were called by telephone at the time of interpretation on 03/26/2021 at 2:57 am to provider [facility] , who verbally acknowledged these results. Electronically Signed   By: Rise Mu M.D.   On: 03/26/2021 03:00    Procedures Procedures   Medications Ordered in ED Medications - No data to display  ED Course  I have reviewed the triage vital signs and the nursing notes.  Pertinent labs & imaging results that were available during my  care of the patient were reviewed by me and considered in my medical decision making (see chart for details).    MDM Rules/Calculators/A&P                          Patient presents to the emergency department with sudden onset of loss of vision on the right side, inability to speak and right arm weakness.  Patient is right-handed.  She has had return to normal vision and her speech is back to normal but she is still weak in her right arm.  After symptoms started to improve she developed a headache.  No previous diagnosis of migraines.  Complicated migraine would be in the differential diagnosis, but stroke cannot be ruled out at arrival.  A code stroke was initiated.  Teleneurology has evaluated the patient.  No tPA.  Recommend admit for MRI to rule out stroke and MS.  CRITICAL CARE Performed by: Gilda Crease   Total critical care time: 35 minutes  Critical care time was exclusive of separately billable procedures and treating other patients.  Critical care was necessary to treat or prevent imminent or life-threatening deterioration.  Critical care was time spent personally by me on the following activities: development of treatment plan with patient and/or surrogate as well as nursing, discussions with consultants, evaluation of patient's response to treatment, examination of patient, obtaining history from patient or surrogate, ordering and performing treatments and interventions, ordering and review of  laboratory studies, ordering and review of radiographic studies, pulse oximetry and re-evaluation of patient's condition.   Final Clinical Impression(s) / ED Diagnoses Final diagnoses:  Right sided weakness    Rx / DC Orders ED Discharge Orders    None       Gilda Crease, MD 03/26/21 [phone]

## 2021-03-26 NOTE — Progress Notes (Signed)
  Echocardiogram 2D Echocardiogram has been performed.  Marissa Hull 03/26/2021, 12:13 PM

## 2021-03-26 NOTE — Progress Notes (Signed)
[facility] Tele[facility]urology Consult [facility]   Date of Service:   03/26/2021 02:39:19  Diagnosis:     .  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  G45.9 - Transient cerebral ischemic attack, unspecified  Impression:     . Patient is a 30 yr old woman with history of headaches and insomnia who presents with a headache and transient episode of right eye vision loss, aphasia/dysarthria and right arm weakness and numbness. The weakness is markedly improved, she has very slight drift but grip strength is equal, FTN testing is normal, able to perform fine motor movements and hold pen without difficulty. Possible CVA/TIA vs demyelinating disease vs CVCS vs complex migraine. Will need MRI brain imaging. Discussed IV thrombolytic but given her improvement, risks of bleeding and mild symptoms patient elected not to proceed with IV tpa. d/w ER physician.  Metrics: Last Known Well: 03/26/2021 00:00:00 [facility] Notification Time: 03/26/2021 02:39:19 Arrival Time: 03/26/2021 02:02:00 Stamp Time: 03/26/2021 02:39:19 Initial Response Time: 03/26/2021 02:45:26 Symptoms: vision loss right eye, right arm weakness, numbness. NIHSS Start Assessment Time: 03/26/2021 02:50:19 Patient is not a candidate for Thrombolytic. Thrombolytic Medical Decision: 03/26/2021 02:56:11 Patient was not deemed candidate for Thrombolytic because of following reasons: Resolved symptoms (no residual disabling symptoms). No disabling symptoms.  CT head showed no acute hemorrhage or acute core infarct. CT head was reviewed and results were: aspects 10, no bleed  ED Physician notified of diagnostic impression and management plan on 03/26/2021 03:13:23  Advanced Imaging: Advanced Imaging Not Recommended because:  Clinical presentation is not suggestion of LVO or Low clinical suspicion of LVO based on presentation   Our recommendations are outlined  below.  Recommendations:      .  Stroke/Telemetry Floor     .  Neuro Checks     .  Bedside Swallow Eval     .  DVT Prophylaxis     .  IV Fluids, Normal Saline     .  Head of Bed 30 Degrees     .  Euglycemia and Avoid Hyperthermia (PRN Acetaminophen)     .  Initiate Aspirin 81 MG Daily     .  MRI brain     .  CUS     .  PT/OT/speech evaluation     .  2d echo  Routine Consultation with Inhouse Neurology for Follow up Care  Sign Out:     .  Discussed with Emergency Department Provider    ------------------------------------------------------------------------------  History of Present Illness: Patient is a 30 year old Female.  Patient was brought by private transportation with symptoms of vision loss right eye, right arm weakness, numbness.  Patient is a 30 yr old woman with history of insomnia, headache who was well until about midnight while blow drying her hair noted right arm weakness and loss of vision in right eye. She reports it lasted 10-20minutes and resolved. She also reported difficulty getting words out when she called her mom after this incident. She does endorse having a headache 6/10, no N/V or dizziness. Patient drove herself to the hospital and ambulated without any difficulty. Her right hand strength is improving, she is able to write, grip strength is symmetrical.   Past Medical History:     . There is NO history of Hypertension     . There is NO history of Diabetes Mellitus     . There is NO history of Hyperlipidemia     .  There is NO history of Atrial Fibrillation     . There is NO history of Coronary Artery Disease     . There is NO history of Stroke  Social History: Smoking: No Alcohol Use: No Drug Use: No  Anticoagulant use:  No  Antiplatelet use: No  Allergies:  Reviewed    Examination: BP(139/97), Pulse(73), Blood Glucose(96) 1A: Level of Consciousness - Alert; keenly responsive + 0 1B: Ask Month and Age - Both Questions Right + 0 1C:  Blink Eyes & Squeeze Hands - Performs Both Tasks + 0 2: Test Horizontal Extraocular Movements - Normal + 0 3: Test Visual Fields - No Visual Loss + 0 4: Test Facial Palsy (Use Grimace if Obtunded) - Normal symmetry + 0 5A: Test Left Arm Motor Drift - No Drift for 10 Seconds + 0 5B: Test Right Arm Motor Drift - Drift, but doesn't hit bed + 1 6A: Test Left Leg Motor Drift - No Drift for 5 Seconds + 0 6B: Test Right Leg Motor Drift - No Drift for 5 Seconds + 0 7: Test Limb Ataxia (FNF/Heel-Shin) - No Ataxia + 0 8: Test Sensation - Normal; No sensory loss + 0 9: Test Language/Aphasia - Normal; No aphasia + 0 10: Test Dysarthria - Normal + 0 11: Test Extinction/Inattention - No abnormality + 0  NIHSS Score: 1   Pre-Morbid Modified Rankin Scale: 0 Points = No symptoms at all   Patient/Family was informed the Neurology Consult would occur via TeleHealth consult by way of interactive audio and video telecommunications and consented to receiving care in this manner.   Patient is being evaluated for possible acute neurologic impairment and high probability of imminent or life-threatening deterioration. I spent total of 39 minutes providing care to this patient, including time for face to face visit via telemedicine, review of medical records, imaging studies and discussion of findings with providers, the patient and/or family.   Dr Natasha Bence Nance Mccombs   [facility] [phone]  Case [id number]

## 2021-03-26 NOTE — ED Notes (Signed)
[facility] is in dispatch.  Report given to gabby, RN @ [facility] 2W

## 2021-03-26 NOTE — ED Notes (Signed)
Code stroke called

## 2021-03-26 NOTE — Evaluation (Signed)
Physical Therapy Evaluation Patient Details Name: Marissa Hull MRN: [id number] DOB: 1991/01/02 Today's Date: 03/26/2021   History of Present Illness  Marissa Hull is a 30 y.o. female with no significant past medical history in the experiencing frontal headache, right UE weakness, vision loss in right eye. MRI and MRA negative.  Clinical Impression  Patient  Demonstrates normal strength and sensation in LE's. Patient ambulated ~ 53 ' without  Overt balance losses.Reports having a HA. [name] Patient has 2 children at home. No further PT needs at this time. PT will sign off.    Follow Up Recommendations No PT follow up    Equipment Recommendations  None recommended by PT    Recommendations for Other Services       Precautions / Restrictions Precautions Precautions: None      Mobility  Bed Mobility Overal bed mobility: Independent                  Transfers Overall transfer level: Independent                  Ambulation/Gait Ambulation/Gait assistance: Independent Gait Distance (Feet): 40 Feet   Gait Pattern/deviations: WFL(Within Functional Limits)     General Gait Details: initially slightly unsteady, states that she is very tired. no overt balance loss.  Stairs            Wheelchair Mobility    Modified Rankin (Stroke Patients Only)       Balance Overall balance assessment: Mild deficits observed, not formally tested                                           Pertinent Vitals/Pain Pain Assessment: Faces Faces Pain Scale: Hurts little more Pain Location: HA Pain Descriptors / Indicators: Aching Pain Intervention(s): Monitored during session    Home Living Family/patient expects to be discharged to:: Private residence Living Arrangements: Children Available Help at Discharge: Family Type of Home: Apartment Home Access: Level entry     Home Layout: One level Home Equipment: None Additional Comments: has 7 and 30 YO.     Prior Function Level of Independence: Independent         Comments: works     Higher education careers adviser   Dominant Hand: Right    Extremity/Trunk Assessment   Upper Extremity Assessment Upper Extremity Assessment: Defer to OT evaluation    Lower Extremity Assessment Lower Extremity Assessment: Overall WFL for tasks assessed    Cervical / Trunk Assessment Cervical / Trunk Assessment: Normal  Communication   Communication: No difficulties  Cognition Arousal/Alertness: Awake/alert Behavior During Therapy: WFL for tasks assessed/performed Overall Cognitive Status: Within Functional Limits for tasks assessed                                        General Comments      Exercises     Assessment/Plan    PT Assessment Patent does not need any further PT services  PT Problem List         PT Treatment Interventions      PT Goals (Current goals can be found in the Care Plan section)  Acute Rehab PT Goals Patient Stated Goal: go home PT Goal Formulation: All assessment and education complete, DC therapy    Frequency  Barriers to discharge        Co-evaluation               AM-PAC PT "6 Clicks" Mobility  Outcome Measure Help needed turning from your back to your side while in a flat bed without using bedrails?: None Help needed moving from lying on your back to sitting on the side of a flat bed without using bedrails?: None Help needed moving to and from a bed to a chair (including a wheelchair)?: None Help needed standing up from a chair using your arms (e.g., wheelchair or bedside chair)?: None Help needed to walk in hospital room?: None Help needed climbing 3-5 steps with a railing? : None 6 Click Score: 24    End of Session   Activity Tolerance: Patient tolerated treatment well Patient left: in bed;with call bell/phone within reach Nurse Communication: Mobility status PT Visit Diagnosis: Unsteadiness on feet (R26.81)    Time:  1030-1314 PT Time Calculation (min) (ACUTE ONLY): 10 min   Charges:   PT Evaluation $PT Eval Low Complexity: 1 Low          Blanchard Kelch PT Acute Rehabilitation Services Pager [phone] Office [phone]   Rada Hay 03/26/2021, 10:36 AM

## 2021-03-26 NOTE — Progress Notes (Signed)
PROGRESS NOTE  Marissa Hull ATF:[id number] DOB: [birth year]   PCP: [facility]  Patient is from: Home  DOA: 03/26/2021 LOS: 0  Chief complaints: Right-sided vision loss, right arm weakness  Brief Narrative / Interim history: 30 year old F with no significant PMH presenting with transient vision loss in right eye for about 5 minutes and right arm weakness and "heaviness".  She had intermittent headache and insomnia for about a week prior to this.  CT head without acute finding.  Teleneurology consulted, and did not feel tPA was indicated given improvement in her symptoms.  Admitted for possible TIA with full CVA work-up.  MRI brain/MRA head and neck without acute finding.  TTE pending.  LDL 162.   Subjective: Seen and examined earlier this morning.  No major events overnight of this morning.  Headache resolved.  No further issues with vision.  No issues with speech.  Still with some weakness in right arm but improved.  Denies focal neuro symptoms as well.  She denies chest pain or dyspnea.  Denies history of migraine headache.  Of note, patient endorses stress at at home and work lately.  Objective: Vitals:   03/26/21 0800 03/26/21 1035 03/26/21 1100 03/26/21 1200  BP: 117/83 135/69 102/71 99/62  Pulse: 65 71 71 68  Resp: 15 15 15 14   Temp:      TempSrc:      SpO2: 97% 99% 97% 95%  Weight:      Height:       No intake or output data in the 24 hours ending 03/26/21 1254 Filed Weights   03/26/21 0209  Weight: 62.6 kg    Examination:  GENERAL: No apparent distress.  Nontoxic. HEENT: MMM.  Vision and hearing grossly intact.  NECK: Supple.  No apparent JVD.  RESP:  No IWOB.  Fair aeration bilaterally. CVS:  RRR. Heart sounds normal.  ABD/GI/GU: BS+. Abd soft, NTND.  MSK/EXT:  Moves extremities. No apparent deformity. No edema.  SKIN: no apparent skin lesion or wound NEURO: Awake, alert and oriented appropriately. Speech clear. Cranial nerves  II-XII intact.  Motor 4+/5 in all muscle groups.  5/5 elsewhere. Light sensation intact in all dermatomes of upper and lower ext bilaterally. Patellar reflex symmetric.  Pronator drift on the right.  Finger to nose intact. PSYCH: Calm. Normal affect.  Procedures:  None  Microbiology summarized: COVID-19 and influenza PCR nonreactive.  Assessment & Plan: Transient visual loss in right eye-resolved. Right upper extremity weakness-unclear etiology.  CVA work-up negative so far.  Atypical migraine?  She has no history of migraine.  Psychogenic?  She reports stress at home and at work lately.  LDL is elevated to 167.  She is still have some residual right upper extremity weakness and pronator drift. -Continue aspirin -Start atorvastatin 40 mg daily -Neuro, Dr. 0210 recommends transfer to [facility] for further evaluation. -Follow TTE -PT/OT   Overweight Body mass index is 26.95 kg/m.         DVT prophylaxis:  enoxaparin (LOVENOX) injection 40 mg Start: 03/26/21 1000  Code Status: Full code Family Communication: Patient and/or RN. Available if any question.  Level of care: Telemetry Medical Status is: Observation  The patient remains OBS appropriate and will d/c before 2 midnights.  Dispo: The patient is from: Home              Anticipated d/c is to: Home              Patient currently is  not medically stable to d/c.   Difficult to place patient No       Consultants:  Neurology   Sch Meds:  Scheduled Meds: . aspirin EC  81 mg Oral Daily  . enoxaparin (LOVENOX) injection  40 mg Subcutaneous Q24H   Continuous Infusions: . sodium chloride 75 mL/hr at 03/26/21 0444   PRN Meds:.acetaminophen **OR** acetaminophen (TYLENOL) oral liquid 160 mg/5 mL **OR** acetaminophen  Antimicrobials: Anti-infectives (From admission, onward)   None       I have personally reviewed the following labs and images: CBC: Recent Labs  Lab 03/26/21 0230 03/26/21 0240 03/26/21 0428  WBC  6.5  --  5.5  NEUTROABS 2.6  --   --   HGB 13.4 13.6 12.1  HCT 39.8 40.0 35.4*  MCV 91.1  --  90.8  PLT 288  --  249   BMP &GFR Recent Labs  Lab 03/26/21 0230 03/26/21 0240 03/26/21 0428  NA 137 140  --   K 3.4* 3.5  --   CL 101 101  --   CO2 28  --   --   GLUCOSE 97 90  --   BUN 13 11  --   CREATININE 0.80 0.80 0.72  CALCIUM 9.7  --   --    Estimated Creatinine Clearance: 84.1 mL/min (by C-G formula based on SCr of 0.72 mg/dL). Liver & Pancreas: Recent Labs  Lab 03/26/21 0230  AST 17  ALT 12  ALKPHOS 40  BILITOT 0.4  PROT 8.0  ALBUMIN 4.4   No results for input(s): LIPASE, AMYLASE in the last 168 hours. No results for input(s): AMMONIA in the last 168 hours. Diabetic: Recent Labs    03/26/21 0429  HGBA1C 5.6   Recent Labs  Lab 03/26/21 0235  GLUCAP 96   Cardiac Enzymes: No results for input(s): CKTOTAL, CKMB, CKMBINDEX, TROPONINI in the last 168 hours. No results for input(s): PROBNP in the last 8760 hours. Coagulation Profile: Recent Labs  Lab 03/26/21 0230  INR 1.1   Thyroid Function Tests: No results for input(s): TSH, T4TOTAL, FREET4, T3FREE, THYROIDAB in the last 72 hours. Lipid Profile: Recent Labs    03/26/21 0430  CHOL 276*  HDL 82  LDLCALC 167*  TRIG 137  CHOLHDL 3.4   Anemia Panel: No results for input(s): VITAMINB12, FOLATE, FERRITIN, TIBC, IRON, RETICCTPCT in the last 72 hours. Urine analysis:    Component Value Date/Time   COLORURINE YELLOW 03/26/2021 0339   APPEARANCEUR HAZY (A) 03/26/2021 0339   LABSPEC 1.031 (H) 03/26/2021 0339   PHURINE 6.0 03/26/2021 0339   GLUCOSEU NEGATIVE 03/26/2021 0339   HGBUR NEGATIVE 03/26/2021 0339   BILIRUBINUR NEGATIVE 03/26/2021 0339   KETONESUR 5 (A) 03/26/2021 0339   PROTEINUR NEGATIVE 03/26/2021 0339   NITRITE NEGATIVE 03/26/2021 0339   LEUKOCYTESUR NEGATIVE 03/26/2021 0339   Sepsis Labs: Invalid input(s): PROCALCITONIN, LACTICIDVEN  Microbiology: Recent Results (from the past  240 hour(s))  Resp Panel by RT-PCR (Flu A&B, Covid) Nasopharyngeal Swab     Status: None   Collection Time: 03/26/21  2:30 AM   Specimen: Nasopharyngeal Swab; Nasopharyngeal(NP) swabs in vial transport medium  Result Value Ref Range Status   SARS Coronavirus 2 by RT PCR NEGATIVE NEGATIVE Final    Comment: (NOTE) SARS-CoV-2 target nucleic acids are NOT DETECTED.  The SARS-CoV-2 RNA is generally detectable in upper respiratory specimens during the acute phase of infection. The lowest concentration of SARS-CoV-2 viral copies this assay can detect is 138 copies/mL. A negative  result does not preclude SARS-Cov-2 infection and should not be used as the sole basis for treatment or other patient management decisions. A negative result may occur with  improper specimen collection/handling, submission of specimen other than nasopharyngeal swab, presence of viral mutation(s) within the areas targeted by this assay, and inadequate number of viral copies(<138 copies/mL). A negative result must be combined with clinical observations, patient history, and epidemiological information. The expected result is Negative.  Fact Sheet for Patients:  BloggerCourse.comhttps://www.fda.gov/media/152166/download  Fact Sheet for Healthcare Providers:  SeriousBroker.ithttps://www.fda.gov/media/152162/download  This test is no t yet approved or cleared by the [place] FDA and  has been authorized for detection and/or diagnosis of SARS-CoV-2 by FDA under an Emergency Use Authorization (EUA). This EUA will remain  in effect (meaning this test can be used) for the duration of the COVID-19 declaration under Section 564(b)(1) of the Act, 21 U.S.C.section 360bbb-3(b)(1), unless the authorization is terminated  or revoked sooner.       Influenza A by PCR NEGATIVE NEGATIVE Final   Influenza B by PCR NEGATIVE NEGATIVE Final    Comment: (NOTE) The Xpert Xpress SARS-CoV-2/FLU/RSV plus assay is intended as an aid in the diagnosis of influenza  from Nasopharyngeal swab specimens and should not be used as a sole basis for treatment. Nasal washings and aspirates are unacceptable for Xpert Xpress SARS-CoV-2/FLU/RSV testing.  Fact Sheet for Patients: BloggerCourse.comhttps://www.fda.gov/media/152166/download  Fact Sheet for Healthcare Providers: SeriousBroker.ithttps://www.fda.gov/media/152162/download  This test is not yet approved or cleared by the [place] FDA and has been authorized for detection and/or diagnosis of SARS-CoV-2 by FDA under an Emergency Use Authorization (EUA). This EUA will remain in effect (meaning this test can be used) for the duration of the COVID-19 declaration under Section 564(b)(1) of the Act, 21 U.S.C. section 360bbb-3(b)(1), unless the authorization is terminated or revoked.  Performed at [facility], [address] [place], [place] [id number]     Radiology Studies: MR ANGIO HEAD WO CONTRAST  Result Date: 03/26/2021 CLINICAL DATA:  Sudden onset right arm numbness/weakness and vision loss, now resolved. EXAM: MRI HEAD WITHOUT CONTRAST MRA HEAD WITHOUT CONTRAST MRA NECK WITHOUT CONTRAST TECHNIQUE: Multiplanar, multiecho pulse sequences of the brain and surrounding structures were obtained without intravenous contrast. Angiographic images of the Circle of Willis were obtained using MRA technique without intravenous contrast. Angiographic images of the neck were obtained using MRA technique without intravenous contrast. Carotid stenosis measurements (when applicable) are obtained utilizing NASCET criteria, using the distal internal carotid diameter as the denominator. COMPARISON:  Head CT 03/26/2021 FINDINGS: MRI HEAD FINDINGS Brain: There is no evidence of an acute infarct, intracranial hemorrhage, mass, midline shift, or extra-axial fluid collection. The ventricles and sulci are normal. The brain is normal in signal. Vascular: Major intracranial vascular flow voids are preserved. Skull and upper cervical spine:  Unremarkable bone marrow signal. Sinuses/Orbits: Unremarkable orbits. Mild mucosal thickening in the paranasal sinuses. Small mucous retention cyst in the left maxillary sinus. Clear mastoid air cells. Other: None. MRA HEAD FINDINGS The intracranial vertebral arteries are widely patent to the basilar with the left being dominant. Patent PICA, AICA, and SCA origins are seen bilaterally. The basilar artery is widely patent. There are right larger than left posterior communicating arteries. Both PCAs are patent without evidence of a significant proximal stenosis. The internal carotid arteries are widely patent from skull base to carotid termini. ACAs and MCAs are patent without evidence of a proximal branch occlusion or significant proximal stenosis. No aneurysm is identified.  MRA NECK FINDINGS There is a normal variant aortic arch branching pattern with common origin of the brachiocephalic and left common carotid arteries. The common carotid and cervical internal carotid arteries are patent without evidence of stenosis or dissection. The vertebral arteries are patent with antegrade flow bilaterally and with the left being mildly dominant. No vertebral artery stenosis or dissection is evident. IMPRESSION: 1. Unremarkable appearance of the brain. 2. Negative head MRA. 3. Negative neck MRA. Electronically Signed   By: Sebastian Ache M.D.   On: 03/26/2021 08:02   MR ANGIO NECK WO CONTRAST  Result Date: 03/26/2021 CLINICAL DATA:  Sudden onset right arm numbness/weakness and vision loss, now resolved. EXAM: MRI HEAD WITHOUT CONTRAST MRA HEAD WITHOUT CONTRAST MRA NECK WITHOUT CONTRAST TECHNIQUE: Multiplanar, multiecho pulse sequences of the brain and surrounding structures were obtained without intravenous contrast. Angiographic images of the Circle of Willis were obtained using MRA technique without intravenous contrast. Angiographic images of the neck were obtained using MRA technique without intravenous contrast.  Carotid stenosis measurements (when applicable) are obtained utilizing NASCET criteria, using the distal internal carotid diameter as the denominator. COMPARISON:  Head CT 03/26/2021 FINDINGS: MRI HEAD FINDINGS Brain: There is no evidence of an acute infarct, intracranial hemorrhage, mass, midline shift, or extra-axial fluid collection. The ventricles and sulci are normal. The brain is normal in signal. Vascular: Major intracranial vascular flow voids are preserved. Skull and upper cervical spine: Unremarkable bone marrow signal. Sinuses/Orbits: Unremarkable orbits. Mild mucosal thickening in the paranasal sinuses. Small mucous retention cyst in the left maxillary sinus. Clear mastoid air cells. Other: None. MRA HEAD FINDINGS The intracranial vertebral arteries are widely patent to the basilar with the left being dominant. Patent PICA, AICA, and SCA origins are seen bilaterally. The basilar artery is widely patent. There are right larger than left posterior communicating arteries. Both PCAs are patent without evidence of a significant proximal stenosis. The internal carotid arteries are widely patent from skull base to carotid termini. ACAs and MCAs are patent without evidence of a proximal branch occlusion or significant proximal stenosis. No aneurysm is identified. MRA NECK FINDINGS There is a normal variant aortic arch branching pattern with common origin of the brachiocephalic and left common carotid arteries. The common carotid and cervical internal carotid arteries are patent without evidence of stenosis or dissection. The vertebral arteries are patent with antegrade flow bilaterally and with the left being mildly dominant. No vertebral artery stenosis or dissection is evident. IMPRESSION: 1. Unremarkable appearance of the brain. 2. Negative head MRA. 3. Negative neck MRA. Electronically Signed   By: Sebastian Ache M.D.   On: 03/26/2021 08:02   MR BRAIN WO CONTRAST  Result Date: 03/26/2021 CLINICAL DATA:   Sudden onset right arm numbness/weakness and vision loss, now resolved. EXAM: MRI HEAD WITHOUT CONTRAST MRA HEAD WITHOUT CONTRAST MRA NECK WITHOUT CONTRAST TECHNIQUE: Multiplanar, multiecho pulse sequences of the brain and surrounding structures were obtained without intravenous contrast. Angiographic images of the Circle of Willis were obtained using MRA technique without intravenous contrast. Angiographic images of the neck were obtained using MRA technique without intravenous contrast. Carotid stenosis measurements (when applicable) are obtained utilizing NASCET criteria, using the distal internal carotid diameter as the denominator. COMPARISON:  Head CT 03/26/2021 FINDINGS: MRI HEAD FINDINGS Brain: There is no evidence of an acute infarct, intracranial hemorrhage, mass, midline shift, or extra-axial fluid collection. The ventricles and sulci are normal. The brain is normal in signal. Vascular: Major intracranial vascular flow voids are preserved. Skull  and upper cervical spine: Unremarkable bone marrow signal. Sinuses/Orbits: Unremarkable orbits. Mild mucosal thickening in the paranasal sinuses. Small mucous retention cyst in the left maxillary sinus. Clear mastoid air cells. Other: None. MRA HEAD FINDINGS The intracranial vertebral arteries are widely patent to the basilar with the left being dominant. Patent PICA, AICA, and SCA origins are seen bilaterally. The basilar artery is widely patent. There are right larger than left posterior communicating arteries. Both PCAs are patent without evidence of a significant proximal stenosis. The internal carotid arteries are widely patent from skull base to carotid termini. ACAs and MCAs are patent without evidence of a proximal branch occlusion or significant proximal stenosis. No aneurysm is identified. MRA NECK FINDINGS There is a normal variant aortic arch branching pattern with common origin of the brachiocephalic and left common carotid arteries. The common  carotid and cervical internal carotid arteries are patent without evidence of stenosis or dissection. The vertebral arteries are patent with antegrade flow bilaterally and with the left being mildly dominant. No vertebral artery stenosis or dissection is evident. IMPRESSION: 1. Unremarkable appearance of the brain. 2. Negative head MRA. 3. Negative neck MRA. Electronically Signed   By: Sebastian Ache M.D.   On: 03/26/2021 08:02   CT HEAD CODE STROKE WO CONTRAST  Result Date: 03/26/2021 CLINICAL DATA:  Code stroke. Initial evaluation for acute right-sided numbness and weakness. EXAM: CT HEAD WITHOUT CONTRAST TECHNIQUE: Contiguous axial images were obtained from the base of the skull through the vertex without intravenous contrast. COMPARISON:  None. FINDINGS: Brain: Cerebral volume within normal limits. No acute intracranial hemorrhage. No acute large vessel territory infarct. No mass lesion, midline shift or mass effect. No hydrocephalus or extra-axial fluid collection. Vascular: No convincing hyperdense vessel. Skull: Scalp soft tissues and calvarium within normal limits. Sinuses/Orbits: Right gaze noted. Globes and orbital soft tissues otherwise unremarkable. Mild mucosal thickening noted within the sphenoid sinuses. Paranasal sinuses are otherwise clear. No mastoid effusion. Other: None. ASPECTS [facility] Stroke Program Early CT Score) - Ganglionic level infarction (caudate, lentiform nuclei, internal capsule, insula, M1-M3 cortex): 7 - Supraganglionic infarction (M4-M6 cortex): 3 Total score (0-10 with 10 being normal): 10 IMPRESSION: 1. Normal head CT.  No acute intracranial abnormality. 2. ASPECTS is 10. Critical Value/emergent results were called by telephone at the time of interpretation on 03/26/2021 at 2:57 am to provider [facility] , who verbally acknowledged these results. Electronically Signed   By: Rise Mu M.D.   On: 03/26/2021 03:00   No charge service.  Admission after  midnight.   Jaymie Misch T. [facility]  If 7PM-7AM, please contact night-coverage [email or web address] 03/26/2021, 12:54 PM

## 2021-03-26 NOTE — H&P (Signed)
History and Physical    Marissa Hull YWV:[id number] DOB: 1991/01/03 DOA: 03/26/2021  PCP: [facility]  Patient coming from: Home.  Chief Complaint: Right-sided vision loss and weakness of the right upper extremity.  HPI: Marissa Hull is a 30 y.o. female with no significant past medical history in the experiencing frontal headache off and on with insomnia for the last few weeks.  Tonight around midnight 12:00 after patient was done with her laundry patient suddenly developed vision loss in the right eye which lasted for around 5 minutes and resolved.  Erythema only patient also has weakness of the right upper extremity numbness patient felt that the right upper extremity was feeling heavy and weak.  At this point patient drove her car to the ER.  By 1 hour patient's symptoms were resolved.  Patient did have headache during the episode.  Denies any weakness of the lower extremities or any difficulty swallowing or speaking.  Usually uses contraceptive pills has not used contraceptive pills for the last 4 days.  ED Course: In the ER CT head was unremarkable.  Tele neurology consulted.  Since patient's symptoms have rapidly improved tPA was not given.  Patient passed swallow.  On exam patient has Weakness of the right upper extremity.  Patient admitted for further work-up for stroke versus TIA.  EKG shows normal sinus rhythm labs show mildly echocardiogram 3.4.  COVID test was negative.  Review of Systems: As per HPI, rest all negative.   History reviewed. No pertinent past medical history.  History reviewed. No pertinent surgical history.   reports that she has never smoked. She has never used smokeless tobacco. She reports that she does not drink alcohol and does not use drugs.  No Known Allergies  History reviewed. No pertinent family history.  Prior to Admission medications   Medication Sig Start Date End Date Taking? Authorizing Provider   metroNIDAZOLE (FLAGYL) 500 MG tablet Take 500 mg by mouth 2 (two) times daily. 11/21/19   [provider]    Physical Exam: Constitutional: Moderately built and nourished. Vitals:   03/26/21 0209 03/26/21 0215 03/26/21 0233  BP: 115/89 114/85 (!) 139/97  Pulse: 83 73 75  Resp: 16  15  Temp: 98.9 F (37.2 C)    TempSrc: Oral    SpO2: 100% 100% 100%  Weight: 62.6 kg    Height: 5' (1.524 m)     Eyes: Anicteric no pallor. ENMT: No discharge from the ears eyes nose and mouth. Neck: No mass felt.  No neck rigidity. Respiratory: No rhonchi or crepitations. Cardiovascular: S1-S2 heard. Abdomen: Soft nontender bowel sound present. Musculoskeletal: No edema. Skin: No rash. Neurologic: Alert awake oriented to time place and person.  Right upper extremity has mild pronator drift.  Rest of the extremities are 5 x 5.  No facial asymmetry Pupils equal react light. Psychiatric: Appears normal.  Normal affect.   Labs on Admission: I have personally reviewed following labs and imaging studies  CBC: Recent Labs  Lab 03/26/21 0230 03/26/21 0240  WBC 6.5  --   NEUTROABS 2.6  --   HGB 13.4 13.6  HCT 39.8 40.0  MCV 91.1  --   PLT 288  --    Basic Metabolic Panel: Recent Labs  Lab 03/26/21 0230 03/26/21 0240  NA 137 140  K 3.4* 3.5  CL 101 101  CO2 28  --   GLUCOSE 97 90  BUN 13 11  CREATININE 0.80 0.80  CALCIUM 9.7  --  GFR: Estimated Creatinine Clearance: 84.1 mL/min (by C-G formula based on SCr of 0.8 mg/dL). Liver Function Tests: Recent Labs  Lab 03/26/21 0230  AST 17  ALT 12  ALKPHOS 40  BILITOT 0.4  PROT 8.0  ALBUMIN 4.4   No results for input(s): LIPASE, AMYLASE in the last 168 hours. No results for input(s): AMMONIA in the last 168 hours. Coagulation Profile: Recent Labs  Lab 03/26/21 0230  INR 1.1   Cardiac Enzymes: No results for input(s): CKTOTAL, CKMB, CKMBINDEX, TROPONINI in the last 168 hours. BNP (last 3 results) No results for  input(s): PROBNP in the last 8760 hours. HbA1C: No results for input(s): HGBA1C in the last 72 hours. CBG: Recent Labs  Lab 03/26/21 0235  GLUCAP 96   Lipid Profile: No results for input(s): CHOL, HDL, LDLCALC, TRIG, CHOLHDL, LDLDIRECT in the last 72 hours. Thyroid Function Tests: No results for input(s): TSH, T4TOTAL, FREET4, T3FREE, THYROIDAB in the last 72 hours. Anemia Panel: No results for input(s): VITAMINB12, FOLATE, FERRITIN, TIBC, IRON, RETICCTPCT in the last 72 hours. Urine analysis:    Component Value Date/Time   COLORURINE YELLOW 03/26/2021 0339   APPEARANCEUR HAZY (A) 03/26/2021 0339   LABSPEC 1.031 (H) 03/26/2021 0339   PHURINE 6.0 03/26/2021 0339   GLUCOSEU NEGATIVE 03/26/2021 0339   HGBUR NEGATIVE 03/26/2021 0339   BILIRUBINUR NEGATIVE 03/26/2021 0339   KETONESUR 5 (A) 03/26/2021 0339   PROTEINUR NEGATIVE 03/26/2021 0339   NITRITE NEGATIVE 03/26/2021 0339   LEUKOCYTESUR NEGATIVE 03/26/2021 0339   Sepsis Labs: @LABRCNTIP (procalcitonin:4,lacticidven:4) ) Recent Results (from the past 240 hour(s))  Resp Panel by RT-PCR (Flu A&B, Covid) Nasopharyngeal Swab     Status: None   Collection Time: 03/26/21  2:30 AM   Specimen: Nasopharyngeal Swab; Nasopharyngeal(NP) swabs in vial transport medium  Result Value Ref Range Status   SARS Coronavirus 2 by RT PCR NEGATIVE NEGATIVE Final    Comment: (NOTE) SARS-CoV-2 target nucleic acids are NOT DETECTED.  The SARS-CoV-2 RNA is generally detectable in upper respiratory specimens during the acute phase of infection. The lowest concentration of SARS-CoV-2 viral copies this assay can detect is 138 copies/mL. A negative result does not preclude SARS-Cov-2 infection and should not be used as the sole basis for treatment or other patient management decisions. A negative result may occur with  improper specimen collection/handling, submission of specimen other than nasopharyngeal swab, presence of viral mutation(s) within  the areas targeted by this assay, and inadequate number of viral copies(<138 copies/mL). A negative result must be combined with clinical observations, patient history, and epidemiological information. The expected result is Negative.  Fact Sheet for Patients:  03/28/21  Fact Sheet for Healthcare Providers:  BloggerCourse.com  This test is no t yet approved or cleared by the [employer] FDA and  has been authorized for detection and/or diagnosis of SARS-CoV-2 by FDA under an Emergency Use Authorization (EUA). This EUA will remain  in effect (meaning this test can be used) for the duration of the COVID-19 declaration under Section 564(b)(1) of the Act, 21 U.S.C.section 360bbb-3(b)(1), unless the authorization is terminated  or revoked sooner.       Influenza A by PCR NEGATIVE NEGATIVE Final   Influenza B by PCR NEGATIVE NEGATIVE Final    Comment: (NOTE) The Xpert Xpress SARS-CoV-2/FLU/RSV plus assay is intended as an aid in the diagnosis of influenza from Nasopharyngeal swab specimens and should not be used as a sole basis for treatment. Nasal washings and aspirates are unacceptable for Xpert Xpress  SARS-CoV-2/FLU/RSV testing.  Fact Sheet for Patients: BloggerCourse.com  Fact Sheet for Healthcare Providers: SeriousBroker.it  This test is not yet approved or cleared by the [place] FDA and has been authorized for detection and/or diagnosis of SARS-CoV-2 by FDA under an Emergency Use Authorization (EUA). This EUA will remain in effect (meaning this test can be used) for the duration of the COVID-19 declaration under Section 564(b)(1) of the Act, 21 U.S.C. section 360bbb-3(b)(1), unless the authorization is terminated or revoked.  Performed at [facility], [address] [place], [place] [address]      Radiological Exams on Admission: CT  HEAD CODE STROKE WO CONTRAST  Result Date: 03/26/2021 CLINICAL DATA:  Code stroke. Initial evaluation for acute right-sided numbness and weakness. EXAM: CT HEAD WITHOUT CONTRAST TECHNIQUE: Contiguous axial images were obtained from the base of the skull through the vertex without intravenous contrast. COMPARISON:  None. FINDINGS: Brain: Cerebral volume within normal limits. No acute intracranial hemorrhage. No acute large vessel territory infarct. No mass lesion, midline shift or mass effect. No hydrocephalus or extra-axial fluid collection. Vascular: No convincing hyperdense vessel. Skull: Scalp soft tissues and calvarium within normal limits. Sinuses/Orbits: Right gaze noted. Globes and orbital soft tissues otherwise unremarkable. Mild mucosal thickening noted within the sphenoid sinuses. Paranasal sinuses are otherwise clear. No mastoid effusion. Other: None. ASPECTS [facility] Stroke Program Early CT Score) - Ganglionic level infarction (caudate, lentiform nuclei, internal capsule, insula, M1-M3 cortex): 7 - Supraganglionic infarction (M4-M6 cortex): 3 Total score (0-10 with 10 being normal): 10 IMPRESSION: 1. Normal head CT.  No acute intracranial abnormality. 2. ASPECTS is 10. Critical Value/emergent results were called by telephone at the time of interpretation on 03/26/2021 at 2:57 am to provider [facility] , who verbally acknowledged these results. Electronically Signed   By: Rise Mu M.D.   On: 03/26/2021 03:00    EKG: Independently reviewed.  Normal sinus rhythm.  Assessment/Plan Principal Problem:   TIA (transient ischemic attack)    1. Possible stroke versus TIA versus migraine -patient still has mild right-sided pronator drift.  Appreciate neurology consult.  Also discussed with Dr. Amada Jupiter neurologist at [facility].  Patient will be transferred to [facility] for further work-up.  MRI brain, MRA brain and neck 2D echo carotid Doppler hemoglobin A1c lipid panel  neurochecks have been ordered.  Physical therapy consult.  Aspirin.  Patient passed swallow.   DVT prophylaxis: Lovenox. Code Status: Full code. Family Communication: Discussed with patient. Disposition Plan: Home. Consults called: Neurology. Admission status: Observation.   Eduard Clos MD [facility] Pager [phone].  If 7PM-7AM, please contact night-coverage [email or web address] Password [facility]  03/26/2021, 4:32 AM

## 2021-03-27 DIAGNOSIS — F419 Anxiety disorder, unspecified: Secondary | ICD-10-CM | POA: Diagnosis not present

## 2021-03-27 DIAGNOSIS — G459 Transient cerebral ischemic attack, unspecified: Secondary | ICD-10-CM | POA: Diagnosis not present

## 2021-03-27 DIAGNOSIS — E7800 Pure hypercholesterolemia, unspecified: Secondary | ICD-10-CM | POA: Diagnosis not present

## 2021-03-27 MED ORDER — ASPIRIN 81 MG PO TBEC: 81.0000 mg | DELAYED_RELEASE_TABLET | Freq: Every day | ORAL | 1 refills | Status: AC

## 2021-03-27 NOTE — Discharge Summary (Addendum)
Physician Discharge Summary  Marissa PorteousJalisa Hull WUJ:[id number] DOB: 12/13/1990 DOA: 03/26/2021  [facility]  Admit date: 03/26/2021 Discharge date: 03/27/2021    Admitted From: Home Disposition: Home  Recommendations for Outpatient Follow-up:  1. Follow up with PCP in 1-2 weeks 2. Please obtain BMP/CBC in one week 3. Please follow up with your PCP on the following pending results: Unresulted Labs (From admission, onward)          Start     Ordered   04/02/21 0500  Creatinine, serum  (enoxaparin (LOVENOX)    CrCl >/= 30 ml/min)  Weekly,   R     Comments: while on enoxaparin therapy    03/26/21 0431            Home Health: None Equipment/Devices: None  Discharge Condition: Stable CODE STATUS: Full code Diet recommendation: Cardiac  Subjective: Seen and examined.  No complaints except some residual right upper extremity weakness.  No problem with the vision.  Brief/Interim Summary: 30 year old F with no significant PMH presented with transient vision loss in right eye for about 5 minutes and right arm weakness and "heaviness".  She had intermittent headache and insomnia for about a week prior to this.  CT head without acute finding.  Teleneurology consulted, and did not feel tPA was indicated given improvement in her symptoms.  Admitted for possible TIA with full CVA work-up.  [facility] neuro recommendations for further work-up. MRI brain/MRA head and neck without acute finding.  TTE with normal ejection fraction and no shunt.[name].  LDL 167 and total cholesterol 276.  Patient was started on atorvastatin.  She was seen by neurology and was cleared for discharge with instructions to continue aspirin low-dose and follow-up in 4 to 6 weeks.  Long discussion with the patient about atorvastatin, she refused to take any medication for lipids and instead wants to commit to lifestyle modification.  She eats 4  eggs daily currently and commits to cut that down and pursue exercise to reduce her cholesterol level.  She was seen by PT OT, no further needs per PT but OT recommended outpatient OT for which referral is made.  She is being discharged in stable condition.  Neurology advised her not to take combination estrogen progesterone pills but instead have prescribed her only progesterone pills for contraceptive purposes.  Discharge Diagnoses:  Principal Problem:   TIA (transient ischemic attack)    Discharge Instructions  Discharge Instructions    Ambulatory referral to Occupational Therapy   Complete by: As directed      Allergies as of 03/27/2021   No Known Allergies     Medication List    STOP taking these medications   Junel FE 1/20 1-20 MG-MCG tablet Generic drug: norethindrone-ethinyl estradiol-FE     TAKE these medications   aspirin 81 MG EC tablet Take 1 tablet (81 mg total) by mouth daily. Swallow whole. Start taking on: Mar 28, 2021   escitalopram 10 MG tablet Commonly known as: LEXAPRO Take 10 mg by mouth every morning.   Ex-Lax 15 MG Tabs Generic drug: Sennosides Take 2 tablets by mouth daily as needed (constipation).   pseudoephedrine-acetaminophen 30-500 MG Tabs tablet Commonly known as: TYLENOL SINUS Take 1 tablet by mouth every 4 (four) hours as needed (cold symptoms).       Follow-up Information    [facility] Follow up in 1 week(s).   Specialty: Family  Medicine Contact information: [address] [place] [address] [phone]        Micki Riley, MD Follow up in 4 week(s).   Specialties: Neurology, Radiology Contact information: [address] [place] [address] [place] [address] [phone]              No Known Allergies  Consultations: Neurology   Procedures/Studies: MR ANGIO HEAD WO CONTRAST  Result Date: 03/26/2021 CLINICAL DATA:  Sudden onset right arm numbness/weakness and  vision loss, now resolved. EXAM: MRI HEAD WITHOUT CONTRAST MRA HEAD WITHOUT CONTRAST MRA NECK WITHOUT CONTRAST TECHNIQUE: Multiplanar, multiecho pulse sequences of the brain and surrounding structures were obtained without intravenous contrast. Angiographic images of the Circle of Willis were obtained using MRA technique without intravenous contrast. Angiographic images of the neck were obtained using MRA technique without intravenous contrast. Carotid stenosis measurements (when applicable) are obtained utilizing NASCET criteria, using the distal internal carotid diameter as the denominator. COMPARISON:  Head CT 03/26/2021 FINDINGS: MRI HEAD FINDINGS Brain: There is no evidence of an acute infarct, intracranial hemorrhage, mass, midline shift, or extra-axial fluid collection. The ventricles and sulci are normal. The brain is normal in signal. Vascular: Major intracranial vascular flow voids are preserved. Skull and upper cervical spine: Unremarkable bone marrow signal. Sinuses/Orbits: Unremarkable orbits. Mild mucosal thickening in the paranasal sinuses. Small mucous retention cyst in the left maxillary sinus. Clear mastoid air cells. Other: None. MRA HEAD FINDINGS The intracranial vertebral arteries are widely patent to the basilar with the left being dominant. Patent PICA, AICA, and SCA origins are seen bilaterally. The basilar artery is widely patent. There are right larger than left posterior communicating arteries. Both PCAs are patent without evidence of a significant proximal stenosis. The internal carotid arteries are widely patent from skull base to carotid termini. ACAs and MCAs are patent without evidence of a proximal branch occlusion or significant proximal stenosis. No aneurysm is identified. MRA NECK FINDINGS There is a normal variant aortic arch branching pattern with common origin of the brachiocephalic and left common carotid arteries. The common carotid and cervical internal carotid arteries are  patent without evidence of stenosis or dissection. The vertebral arteries are patent with antegrade flow bilaterally and with the left being mildly dominant. No vertebral artery stenosis or dissection is evident. IMPRESSION: 1. Unremarkable appearance of the brain. 2. Negative head MRA. 3. Negative neck MRA. Electronically Signed   By: Sebastian Ache M.D.   On: 03/26/2021 08:02   MR ANGIO NECK WO CONTRAST  Result Date: 03/26/2021 CLINICAL DATA:  Sudden onset right arm numbness/weakness and vision loss, now resolved. EXAM: MRI HEAD WITHOUT CONTRAST MRA HEAD WITHOUT CONTRAST MRA NECK WITHOUT CONTRAST TECHNIQUE: Multiplanar, multiecho pulse sequences of the brain and surrounding structures were obtained without intravenous contrast. Angiographic images of the Circle of Willis were obtained using MRA technique without intravenous contrast. Angiographic images of the neck were obtained using MRA technique without intravenous contrast. Carotid stenosis measurements (when applicable) are obtained utilizing NASCET criteria, using the distal internal carotid diameter as the denominator. COMPARISON:  Head CT 03/26/2021 FINDINGS: MRI HEAD FINDINGS Brain: There is no evidence of an acute infarct, intracranial hemorrhage, mass, midline shift, or extra-axial fluid collection. The ventricles and sulci are normal. The brain is normal in signal. Vascular: Major intracranial vascular flow voids are preserved. Skull and upper cervical spine: Unremarkable bone marrow signal. Sinuses/Orbits: Unremarkable orbits. Mild mucosal thickening in the paranasal sinuses. Small mucous retention cyst in the left  maxillary sinus. Clear mastoid air cells. Other: None. MRA HEAD FINDINGS The intracranial vertebral arteries are widely patent to the basilar with the left being dominant. Patent PICA, AICA, and SCA origins are seen bilaterally. The basilar artery is widely patent. There are right larger than left posterior communicating arteries. Both  PCAs are patent without evidence of a significant proximal stenosis. The internal carotid arteries are widely patent from skull base to carotid termini. ACAs and MCAs are patent without evidence of a proximal branch occlusion or significant proximal stenosis. No aneurysm is identified. MRA NECK FINDINGS There is a normal variant aortic arch branching pattern with common origin of the brachiocephalic and left common carotid arteries. The common carotid and cervical internal carotid arteries are patent without evidence of stenosis or dissection. The vertebral arteries are patent with antegrade flow bilaterally and with the left being mildly dominant. No vertebral artery stenosis or dissection is evident. IMPRESSION: 1. Unremarkable appearance of the brain. 2. Negative head MRA. 3. Negative neck MRA. Electronically Signed   By: Sebastian Ache M.D.   On: 03/26/2021 08:02   MR BRAIN WO CONTRAST  Result Date: 03/26/2021 CLINICAL DATA:  Sudden onset right arm numbness/weakness and vision loss, now resolved. EXAM: MRI HEAD WITHOUT CONTRAST MRA HEAD WITHOUT CONTRAST MRA NECK WITHOUT CONTRAST TECHNIQUE: Multiplanar, multiecho pulse sequences of the brain and surrounding structures were obtained without intravenous contrast. Angiographic images of the Circle of Willis were obtained using MRA technique without intravenous contrast. Angiographic images of the neck were obtained using MRA technique without intravenous contrast. Carotid stenosis measurements (when applicable) are obtained utilizing NASCET criteria, using the distal internal carotid diameter as the denominator. COMPARISON:  Head CT 03/26/2021 FINDINGS: MRI HEAD FINDINGS Brain: There is no evidence of an acute infarct, intracranial hemorrhage, mass, midline shift, or extra-axial fluid collection. The ventricles and sulci are normal. The brain is normal in signal. Vascular: Major intracranial vascular flow voids are preserved. Skull and upper cervical spine:  Unremarkable bone marrow signal. Sinuses/Orbits: Unremarkable orbits. Mild mucosal thickening in the paranasal sinuses. Small mucous retention cyst in the left maxillary sinus. Clear mastoid air cells. Other: None. MRA HEAD FINDINGS The intracranial vertebral arteries are widely patent to the basilar with the left being dominant. Patent PICA, AICA, and SCA origins are seen bilaterally. The basilar artery is widely patent. There are right larger than left posterior communicating arteries. Both PCAs are patent without evidence of a significant proximal stenosis. The internal carotid arteries are widely patent from skull base to carotid termini. ACAs and MCAs are patent without evidence of a proximal branch occlusion or significant proximal stenosis. No aneurysm is identified. MRA NECK FINDINGS There is a normal variant aortic arch branching pattern with common origin of the brachiocephalic and left common carotid arteries. The common carotid and cervical internal carotid arteries are patent without evidence of stenosis or dissection. The vertebral arteries are patent with antegrade flow bilaterally and with the left being mildly dominant. No vertebral artery stenosis or dissection is evident. IMPRESSION: 1. Unremarkable appearance of the brain. 2. Negative head MRA. 3. Negative neck MRA. Electronically Signed   By: Sebastian Ache M.D.   On: 03/26/2021 08:02   ECHOCARDIOGRAM COMPLETE  Result Date: 03/26/2021    ECHOCARDIOGRAM REPORT   Patient Name:   DIA Norberto Date of Exam: 03/26/2021 Medical Rec #:  [id number]     Height:       60.0 in Accession #:    [id number]    Weight:  138.0 lb Date of Birth:  [birth year]    BSA:          1.594 m Patient Age:    29 years      BP:           99/62 mmHg Patient Gender: F             HR:           66 bpm. Exam Location:  Inpatient Procedure: 3D Echo, 2D Echo, Cardiac Doppler and Color Doppler Indications:    Stroke  History:        Patient has no prior history of  Echocardiogram examinations.                 TIA. No prior cardiac history.  Sonographer:    Sheralyn Boatman RDCS Referring Phys: [phone] Meryle Ready [facility] IMPRESSIONS  1. Left ventricular ejection fraction, by estimation, is 60 to 65%. The left ventricle has normal function. The left ventricle has no regional wall motion abnormalities. There is mild left ventricular hypertrophy. Left ventricular diastolic parameters were normal.  2. Right ventricular systolic function is normal. The right ventricular size is normal. There is normal pulmonary artery systolic pressure.  3. The mitral valve is normal in structure. Trivial mitral valve regurgitation.  4. Tricuspid valve regurgitation is mild to moderate.  5. The aortic valve is tricuspid. Aortic valve regurgitation is not visualized. Mild aortic valve sclerosis is present, with no evidence of aortic valve stenosis. FINDINGS  Left Ventricle: Left ventricular ejection fraction, by estimation, is 60 to 65%. The left ventricle has normal function. The left ventricle has no regional wall motion abnormalities. The left ventricular internal cavity size was normal in size. There is  mild left ventricular hypertrophy. Left ventricular diastolic parameters were normal. Right Ventricle: The right ventricular size is normal. Right vetricular wall thickness was not assessed. Right ventricular systolic function is normal. There is normal pulmonary artery systolic pressure. The tricuspid regurgitant velocity is 1.96 m/s, and with an assumed right atrial pressure of 3 mmHg, the estimated right ventricular systolic pressure is 18.4 mmHg. Left Atrium: Left atrial size was normal in size. Right Atrium: Right atrial size was normal in size. Pericardium: There is no evidence of pericardial effusion. Mitral Valve: The mitral valve is normal in structure. Trivial mitral valve regurgitation. Tricuspid Valve: The tricuspid valve is normal in structure. Tricuspid valve regurgitation is mild to  moderate. Aortic Valve: The aortic valve is tricuspid. Aortic valve regurgitation is not visualized. Mild aortic valve sclerosis is present, with no evidence of aortic valve stenosis. Pulmonic Valve: The pulmonic valve was normal in structure. Pulmonic valve regurgitation is not visualized. Aorta: The aortic root and ascending aorta are structurally normal, with no evidence of dilitation.  LEFT VENTRICLE PLAX 2D LVIDd:         3.90 cm     Diastology LVIDs:         2.60 cm     LV e' medial:    9.90 cm/s LV PW:         1.40 cm     LV E/e' medial:  7.8 LV IVS:        1.00 cm     LV e' lateral:   15.40 cm/s LVOT diam:     2.00 cm     LV E/e' lateral: 5.0 LV SV:         56 LV SV Index:   35 LVOT Area:  3.14 cm                             3D Volume EF: LV Volumes (MOD)           3D EF:        61 % LV vol d, MOD A2C: 66.8 ml LV EDV:       98 ml LV vol d, MOD A4C: 71.2 ml LV ESV:       38 ml LV vol s, MOD A2C: 24.4 ml LV SV:        60 ml LV vol s, MOD A4C: 23.7 ml LV SV MOD A2C:     42.4 ml LV SV MOD A4C:     71.2 ml LV SV MOD BP:      47.4 ml RIGHT VENTRICLE             IVC RV S prime:     14.50 cm/s  IVC diam: 1.90 cm TAPSE (M-mode): 2.4 cm LEFT ATRIUM             Index       RIGHT ATRIUM           Index LA diam:        2.30 cm 1.44 cm/m  RA Area:     12.40 cm LA Vol (A2C):   15.5 ml 9.72 ml/m  RA Volume:   27.70 ml  17.37 ml/m LA Vol (A4C):   16.8 ml 10.54 ml/m LA Biplane Vol: 17.1 ml 10.73 ml/m  AORTIC VALVE LVOT Vmax:   97.00 cm/s LVOT Vmean:  69.800 cm/s LVOT VTI:    0.178 m  AORTA Ao Root diam: 3.20 cm Ao Asc diam:  2.90 cm MITRAL VALVE               TRICUSPID VALVE MV Area (PHT): 2.66 cm    TR Peak grad:   15.4 mmHg MV Decel Time: 285 msec    TR Vmax:        196.00 cm/s MV E velocity: 77.50 cm/s MV A velocity: 49.10 cm/s  SHUNTS MV E/A ratio:  1.58        Systemic VTI:  0.18 m                            Systemic Diam: 2.00 cm Dietrich Pates MD Electronically signed by Dietrich Pates MD Signature Date/Time:  03/26/2021/4:07:52 PM    Final    CT HEAD CODE STROKE WO CONTRAST  Result Date: 03/26/2021 CLINICAL DATA:  Code stroke. Initial evaluation for acute right-sided numbness and weakness. EXAM: CT HEAD WITHOUT CONTRAST TECHNIQUE: Contiguous axial images were obtained from the base of the skull through the vertex without intravenous contrast. COMPARISON:  None. FINDINGS: Brain: Cerebral volume within normal limits. No acute intracranial hemorrhage. No acute large vessel territory infarct. No mass lesion, midline shift or mass effect. No hydrocephalus or extra-axial fluid collection. Vascular: No convincing hyperdense vessel. Skull: Scalp soft tissues and calvarium within normal limits. Sinuses/Orbits: Right gaze noted. Globes and orbital soft tissues otherwise unremarkable. Mild mucosal thickening noted within the sphenoid sinuses. Paranasal sinuses are otherwise clear. No mastoid effusion. Other: None. ASPECTS [facility] Stroke Program Early CT Score) - Ganglionic level infarction (caudate, lentiform nuclei, internal capsule, insula, M1-M3 cortex): 7 - Supraganglionic infarction (M4-M6 cortex): 3 Total score (0-10 with 10 being normal): 10 IMPRESSION: 1. Normal head CT.  No acute intracranial abnormality. 2. ASPECTS is 10. Critical Value/emergent results were called by telephone at the time of interpretation on 03/26/2021 at 2:57 am to provider [facility] , who verbally acknowledged these results. Electronically Signed   By: Rise Mu M.D.   On: 03/26/2021 03:00     Discharge Exam: Vitals:   03/27/21 0548 03/27/21 0802  BP: 125/70 126/84  Pulse: 80 61  Resp: 17 18  Temp: 99.1 F (37.3 C) 98.4 F (36.9 C)  SpO2: 97% 100%   Vitals:   03/26/21 1305 03/26/21 2200 03/27/21 0548 03/27/21 0802  BP: 118/85 111/77 125/70 126/84  Pulse: 67 81 80 61  Resp: 14 16 17 18   Temp: 98.5 F (36.9 C) 99 F (37.2 C) 99.1 F (37.3 C) 98.4 F (36.9 C)  TempSrc: Oral Oral Oral Oral  SpO2: 100% 99%  97% 100%  Weight:      Height:        General: Pt is alert, awake, not in acute distress Cardiovascular: RRR, S1/S2 +, no rubs, no gallops Respiratory: CTA bilaterally, no wheezing, no rhonchi Abdominal: Soft, NT, ND, bowel sounds + Extremities: no edema, no cyanosis Neuro: Cranial nerves intact 2-12, minimal right upper extremity weakness with drift.  No other deficit.    The results of significant diagnostics from this hospitalization (including imaging, microbiology, ancillary and laboratory) are listed below for reference.     Microbiology: Recent Results (from the past 240 hour(s))  Resp Panel by RT-PCR (Flu A&B, Covid) Nasopharyngeal Swab     Status: None   Collection Time: 03/26/21  2:30 AM   Specimen: Nasopharyngeal Swab; Nasopharyngeal(NP) swabs in vial transport medium  Result Value Ref Range Status   SARS Coronavirus 2 by RT PCR NEGATIVE NEGATIVE Final    Comment: (NOTE) SARS-CoV-2 target nucleic acids are NOT DETECTED.  The SARS-CoV-2 RNA is generally detectable in upper respiratory specimens during the acute phase of infection. The lowest concentration of SARS-CoV-2 viral copies this assay can detect is 138 copies/mL. A negative result does not preclude SARS-Cov-2 infection and should not be used as the sole basis for treatment or other patient management decisions. A negative result may occur with  improper specimen collection/handling, submission of specimen other than nasopharyngeal swab, presence of viral mutation(s) within the areas targeted by this assay, and inadequate number of viral copies(<138 copies/mL). A negative result must be combined with clinical observations, patient history, and epidemiological information. The expected result is Negative.  Fact Sheet for Patients:  03/28/21  Fact Sheet for Healthcare Providers:  BloggerCourse.com  This test is no t yet approved or cleared by the  [employer]ious[employer].[place] FDA and  has been authorized for detection and/or diagnosis of SARS-CoV-2 by FDA under an Emergency Use Authorization (EUA). This EUA will remain  in effect (meaning this test can be used) for the duration of the COVID-19 declaration under Section 564(b)(1) of the Act, 21 U.S.C.section 360bbb-3(b)(1), unless the authorization is terminated  or revoked sooner.       Influenza A by PCR NEGATIVE NEGATIVE Final   Influenza B by PCR NEGATIVE NEGATIVE Final    Comment: (NOTE) The Xpert Xpress SARS-CoV-2/FLU/RSV plus assay is intended as an aid in the diagnosis of influenza from Nasopharyngeal swab specimens and should not be used as a sole basis for treatment. Nasal washings and aspirates are unacceptable for Xpert Xpress SARS-CoV-2/FLU/RSV testing.  Fact Sheet for Patients: [place]  Fact Sheet for Healthcare Providers: BloggerCourse.com  This test is not yet  approved or cleared by the [place] and has been authorized for detection and/or diagnosis of SARS-CoV-2 by FDA under an Emergency Use Authorization (EUA). This EUA will remain in effect (meaning this test can be used) for the duration of the COVID-19 declaration under Section 564(b)(1) of the Act, 21 U.S.C. section 360bbb-3(b)(1), unless the authorization is terminated or revoked.  Performed at [facility], [address], [place], [place] [address]      Labs: BNP (last 3 results) No results for input(s): BNP in the last 8760 hours. Basic Metabolic Panel: Recent Labs  Lab 03/26/21 0230 03/26/21 0240 03/26/21 0428  NA 137 140  --   K 3.4* 3.5  --   CL 101 101  --   CO2 28  --   --   GLUCOSE 97 90  --   BUN 13 11  --   CREATININE 0.80 0.80 0.72  CALCIUM 9.7  --   --    Liver Function Tests: Recent Labs  Lab 03/26/21 0230  AST 17  ALT 12  ALKPHOS 40  BILITOT 0.4  PROT 8.0  ALBUMIN 4.4   No results for  input(s): LIPASE, AMYLASE in the last 168 hours. No results for input(s): AMMONIA in the last 168 hours. CBC: Recent Labs  Lab 03/26/21 0230 03/26/21 0240 03/26/21 0428  WBC 6.5  --  5.5  NEUTROABS 2.6  --   --   HGB 13.4 13.6 12.1  HCT 39.8 40.0 35.4*  MCV 91.1  --  90.8  PLT 288  --  249   Cardiac Enzymes: No results for input(s): CKTOTAL, CKMB, CKMBINDEX, TROPONINI in the last 168 hours. BNP: Invalid input(s): POCBNP CBG: Recent Labs  Lab 03/26/21 0235  GLUCAP 96   D-Dimer No results for input(s): DDIMER in the last 72 hours. Hgb A1c Recent Labs    03/26/21 0429  HGBA1C 5.6   Lipid Profile Recent Labs    03/26/21 0430  CHOL 276*  HDL 82  LDLCALC 167*  TRIG 137  CHOLHDL 3.4   Thyroid function studies No results for input(s): TSH, T4TOTAL, T3FREE, THYROIDAB in the last 72 hours.  Invalid input(s): FREET3 Anemia work up No results for input(s): VITAMINB12, FOLATE, FERRITIN, TIBC, IRON, RETICCTPCT in the last 72 hours. Urinalysis    Component Value Date/Time   COLORURINE YELLOW 03/26/2021 0339   APPEARANCEUR HAZY (A) 03/26/2021 0339   LABSPEC 1.031 (H) 03/26/2021 0339   PHURINE 6.0 03/26/2021 0339   GLUCOSEU NEGATIVE 03/26/2021 0339   HGBUR NEGATIVE 03/26/2021 0339   BILIRUBINUR NEGATIVE 03/26/2021 0339   KETONESUR 5 (A) 03/26/2021 0339   PROTEINUR NEGATIVE 03/26/2021 0339   NITRITE NEGATIVE 03/26/2021 0339   LEUKOCYTESUR NEGATIVE 03/26/2021 0339   Sepsis Labs Invalid input(s): PROCALCITONIN,  WBC,  LACTICIDVEN Microbiology Recent Results (from the past 240 hour(s))  Resp Panel by RT-PCR (Flu A&B, Covid) Nasopharyngeal Swab     Status: None   Collection Time: 03/26/21  2:30 AM   Specimen: Nasopharyngeal Swab; Nasopharyngeal(NP) swabs in vial transport medium  Result Value Ref Range Status   SARS Coronavirus 2 by RT PCR NEGATIVE NEGATIVE Final    Comment: (NOTE) SARS-CoV-2 target nucleic acids are NOT DETECTED.  The SARS-CoV-2 RNA is generally  detectable in upper respiratory specimens during the acute phase of infection. The lowest concentration of SARS-CoV-2 viral copies this assay can detect is 138 copies/mL. A negative result does not preclude SARS-Cov-2 infection and should not be used as the sole  basis for treatment or other patient management decisions. A negative result may occur with  improper specimen collection/handling, submission of specimen other than nasopharyngeal swab, presence of viral mutation(s) within the areas targeted by this assay, and inadequate number of viral copies(<138 copies/mL). A negative result must be combined with clinical observations, patient history, and epidemiological information. The expected result is Negative.  Fact Sheet for Patients:  BloggerCourse.com  Fact Sheet for Healthcare Providers:  SeriousBroker.it  This test is no t yet approved or cleared by the [place] FDA and  has been authorized for detection and/or diagnosis of SARS-CoV-2 by FDA under an Emergency Use Authorization (EUA). This EUA will remain  in effect (meaning this test can be used) for the duration of the COVID-19 declaration under Section 564(b)(1) of the Act, 21 U.S.C.section 360bbb-3(b)(1), unless the authorization is terminated  or revoked sooner.       Influenza A by PCR NEGATIVE NEGATIVE Final   Influenza B by PCR NEGATIVE NEGATIVE Final    Comment: (NOTE) The Xpert Xpress SARS-CoV-2/FLU/RSV plus assay is intended as an aid in the diagnosis of influenza from Nasopharyngeal swab specimens and should not be used as a sole basis for treatment. Nasal washings and aspirates are unacceptable for Xpert Xpress SARS-CoV-2/FLU/RSV testing.  Fact Sheet for Patients: BloggerCourse.com  Fact Sheet for Healthcare Providers: SeriousBroker.it  This test is not yet approved or cleared by the [place] FDA  and has been authorized for detection and/or diagnosis of SARS-CoV-2 by FDA under an Emergency Use Authorization (EUA). This EUA will remain in effect (meaning this test can be used) for the duration of the COVID-19 declaration under Section 564(b)(1) of the Act, 21 U.S.C. section 360bbb-3(b)(1), unless the authorization is terminated or revoked.  Performed at [facility], [address] [place], [place] [address]      Time coordinating discharge: Over 30 minutes  SIGNED:   Hughie Closs, MD  [facility] 03/27/2021, 12:56 PM  If 7PM-7AM, please contact night-coverage [email or web address]

## 2021-03-27 NOTE — Progress Notes (Addendum)
STROKE TEAM PROGRESS NOTE   INTERVAL HISTORY 30 year old F with no significant PMH presented with transient vision loss in right eye for about 10-15 minutesandright arm weakness.  She had intermittent headache and insomnia for about a week prior to this. CT head without acute finding. MRI brain/MRA head and neck without acute finding. TTE with normal ejection fraction and no shunt. hgb A1c 5.6. LDL 167 and total cholesterol 276.      Vitals:   03/26/21 1305 03/26/21 2200 03/27/21 0548 03/27/21 0802  BP: 118/85 111/77 125/70 126/84  Pulse: 67 81 80 61  Resp: 14 16 17 18   Temp: 98.5 F (36.9 C) 99 F (37.2 C) 99.1 F (37.3 C) 98.4 F (36.9 C)  TempSrc: Oral Oral Oral Oral  SpO2: 100% 99% 97% 100%  Weight:      Height:       CBC:  Recent Labs  Lab 03/26/21 0230 03/26/21 0240 03/26/21 0428  WBC 6.5  --  5.5  NEUTROABS 2.6  --   --   HGB 13.4 13.6 12.1  HCT 39.8 40.0 35.4*  MCV 91.1  --  90.8  PLT 288  --  249   Basic Metabolic Panel:  Recent Labs  Lab 03/26/21 0230 03/26/21 0240 03/26/21 0428  NA 137 140  --   K 3.4* 3.5  --   CL 101 101  --   CO2 28  --   --   GLUCOSE 97 90  --   BUN 13 11  --   CREATININE 0.80 0.80 0.72  CALCIUM 9.7  --   --    Lipid Panel:  Recent Labs  Lab 03/26/21 0430  CHOL 276*  TRIG 137  HDL 82  CHOLHDL 3.4  VLDL 27  LDLCALC 03/28/21*   HgbA1c:  Recent Labs  Lab 03/26/21 0429  HGBA1C 5.6   Urine Drug Screen:  Recent Labs  Lab 03/26/21 0339  LABOPIA NONE DETECTED  COCAINSCRNUR NONE DETECTED  LABBENZ NONE DETECTED  AMPHETMU NONE DETECTED  THCU NONE DETECTED  LABBARB NONE DETECTED    Alcohol Level  Recent Labs  Lab 03/26/21 0230  ETH <10    IMAGING   ECHOCARDIOGRAM  Result Date: 03/26/2021  IMPRESSIONS   1. Left ventricular ejection fraction, by estimation, is 60 to 65%. The left ventricle has normal function. The left ventricle has no regional wall motion abnormalities. There is mild left ventricular  hypertrophy. Left ventricular diastolic parameters were normal.   2. Right ventricular systolic function is normal. The right ventricular size is normal. There is normal pulmonary artery systolic pressure.   3. The mitral valve is normal in structure. Trivial mitral valve regurgitation.   4. Tricuspid valve regurgitation is mild to moderate.   5. The aortic valve is tricuspid. Aortic valve regurgitation is not visualized. Mild aortic valve sclerosis is present, with no evidence of aortic valve stenosis.   CT head Result Date 03/27/2021 IMPRESSION: 1. Normal head CT.  No acute intracranial abnormality. 2. ASPECTS is 10.  MRI Brain/MRA head and neck IMPRESSION: 1. Unremarkable appearance of the brain. 2. Negative head MRA. 3. Negative neck MRA.  PHYSICAL EXAM General - Well nourished, well developed female sitting up in bed in no apparent distress  Ophthalmologic - fundi not visualized due to noncooperation. Cardiovascular - Regular rate and rhythm.  Mental Status -  Level of arousal and orientation to time, place, and person were intact. Language including expression, naming, repetition, comprehension was assessed and found intact. Attention  span and concentration were normal. Recent and remote memory were intact.  Fund of Knowledge was assessed and was intact.  Cranial Nerves II - XII - II - Visual field intact OU. III, IV, VI - Extraocular movements intact V - Facial sensation intact bilaterally VII - Facial movement intact bilaterally VIII - Hearing & vestibular intact bilaterally X - Palate elevates symmetrically XI - Chin turning & shoulder shrug intact bilaterally XII - Tongue protrusion intact  Motor Strength - The patient's strength was normal in left upper and bilateral extremities and pronator drift was absent.  right upper mild intermittent weakness 4+/5 (full strength once encouraged to full participate) mild drift but self corrects.  Bulk was normal and  fasciculations were absent.   Motor Tone - Muscle tone was assessed at the neck and appendages and was normal..  Reflexes - The patient's reflexes were symmetrical in all extremities and she had no pathological reflexes.  Sensory - Light touch, temperature/pinprick were assessed and were symmetrical.    Coordination - The patient had normal movements in the hands and feet with no ataxia or dysmetria.  Tremor was absent.  Gait and Station - deferred.   ASSESSMENT/PLAN [name] is a 30 y.o. female with history of headaches and insomnia presenting with transient episode of right eye vision loss, right arm weakness and numbness.   Anxiety vs complicated migraine  Code Stroke CT head No acute abnormality. ASPECTS 10.   MRI  unremakable  MRA unremarkable  2D Echo EF 60-65%, mild LVH  LDL 167  HgbA1c 5.6  VTE prophylaxis - lovenox  Diet: regular  No antithrombotic prior to admission, now on aspirin 81 mg daily. Recommend Aspirin 81mg  until she is seen in neurology clinic   Therapy recommendations:  PT no needs, Outpatient PT  Disposition:  home  Hypertension  Home meds:  none  Stable . Goal normotensive  Hyperlipidemia  Home meds:  none  LDL 167, goal < 70  Atorvastatin 40mg  discontinued. Patient refused to take statins and would like to try exercise and diet modifications.  I spoke to the patient about if her LDL remains elevated she should consider taking a cholesterol lowering agent.  I also explained that women of childbearing age who are treated with statin therapy and are sexually active should be use a reliable form of contraception.  We recommended stop taking Junel FE.  We recommend non-pharmacological contraception as first line. If she has to take a birth control we recommend a progesterone only birth control.  We also discussed if she starts a statin and plans to become pregnant she should stop the statin 1 to 2 months before pregnancy is  attempted or if she becomes pregnant while on a statin, she should stop the statin as soon as the pregnancy is discovered per 2019 guidelines.  Other Stroke Risk Factors  Obesity, Body mass index is 26.95 kg/m., BMI >/= 30 associated with increased stroke risk, recommend weight loss, diet and exercise as appropriate   Migraines   Hospital day # 0  Lissy Olivencia-Simmons, ACNP-BC Stroke NP  ATTENDING NOTE: I reviewed above note and agree with the assessment and plan. Pt was seen and examined.   30 year old female with history of headache, insomnia admitted for episode of right eye vision loss, right arm weakness and numbness.  She stated that she was in the bathroom cleaning her up, had sudden onset right eye vision loss, seeing black for about 3 to 5 minutes and resolved.  She also felt left arm numbness tingling and noted to pick up things, that also improved over time and currently she felt nearly back to normal.  CT, MRI, MRA head and neck, 2D echo all negative.  UDS negative.  A1c 5.6, LDL 167.  Creatinine 0.72.  On today's exam, patient neurologically intact except right arm drift without pronation, and is distractible.  With distraction, right arm no drift.  Etiology for patient symptoms likely due to anxiety versus complicated migraine, as per patient that she had a lot of stress recently and she did have history of headache likely migraine.  She also reported taking JUNEL FE for contraception.  I recommended that nonpharmacological contraception will be the first-line given her history of migraine and current episode.  If pharmacological contraception is necessary, she was recommended to talk to her OB doctor to consider progesterone only medication instead of combination medication.  Okay to continue aspirin 81 at this time until further follow-up in clinic.  Her LDL 167, could be a candidate for statin, however she is child bearing age, need to be cautious with statin in the  setting of pregnancy.  Patient currently declined statin medication at this time, she will further discuss with her PCP and OB physician.  For detailed assessment and plan, please refer to above as I have made changes wherever appropriate.   Neurology will sign off. Please call with questions. Pt will follow up with stroke clinic NP at [facility] in about 4 weeks. Thanks for the consult.   Marvel Plan, MD PhD Stroke Neurology 03/27/2021 5:09 PM     To contact Stroke Continuity provider, please refer to [email or web address]. After hours, contact General Neurology

## 2021-03-27 NOTE — Plan of Care (Signed)
  Problem: Education: Goal: Knowledge of General Education information will improve Description: Including pain rating scale, medication(s)/side effects and non-pharmacologic comfort measures Outcome: Adequate for Discharge   Problem: Health Behavior/Discharge Planning: Goal: Ability to manage health-related needs will improve Outcome: Adequate for Discharge   Problem: Clinical Measurements: Goal: Ability to maintain clinical measurements within normal limits will improve Outcome: Adequate for Discharge Goal: Will remain free from infection Outcome: Adequate for Discharge Goal: Diagnostic test results will improve Outcome: Adequate for Discharge Goal: Respiratory complications will improve Outcome: Adequate for Discharge Goal: Cardiovascular complication will be avoided Outcome: Adequate for Discharge   Problem: Activity: Goal: Risk for activity intolerance will decrease Outcome: Adequate for Discharge   Problem: Nutrition: Goal: Adequate nutrition will be maintained Outcome: Adequate for Discharge   Problem: Coping: Goal: Level of anxiety will decrease Outcome: Adequate for Discharge   Problem: Elimination: Goal: Will not experience complications related to bowel motility Outcome: Adequate for Discharge Goal: Will not experience complications related to urinary retention Outcome: Adequate for Discharge   Problem: Pain Managment: Goal: General experience of comfort will improve Outcome: Adequate for Discharge   Problem: Safety: Goal: Ability to remain free from injury will improve Outcome: Adequate for Discharge   Problem: Skin Integrity: Goal: Risk for impaired skin integrity will decrease Outcome: Adequate for Discharge   Problem: Education: Goal: Knowledge of disease or condition will improve Outcome: Adequate for Discharge Goal: Knowledge of secondary prevention will improve Outcome: Adequate for Discharge Goal: Knowledge of patient specific risk factors  addressed and post discharge goals established will improve Outcome: Adequate for Discharge Goal: Individualized Educational Video(s) Outcome: Adequate for Discharge   

## 2021-03-27 NOTE — Discharge Instructions (Signed)
You are taking birth control pills called JUNEL FE which is a combination of estrogen and progesterone hormones. Given your current episode and migraine history, we recommend non-pharmacological means for contraception as first line. Please discuss with you OB physician, if you have to have pharmacological means for contraception, we recommend progesterone only birth control pills. Please discuss with your OB physician after discharge.

## 2021-03-27 NOTE — Progress Notes (Signed)
SLP Cancellation Note  Patient Details Name: Marissa Hull MRN: [id number] DOB: [birth year]   Cancelled treatment:       Reason Eval/Treat Not Completed: neurological imaging workup negative for acute intracranial abnormalities. SLP screened, no needs identified, will sign off   Ardyth Gal MA, CCC-SLP Acute Rehabilitation Services   03/27/2021, 10:33 AM

## 2021-04-02 ENCOUNTER — Ambulatory Visit: Payer: 59 | Attending: Family Medicine | Admitting: Occupational Therapy

## 2021-04-02 DIAGNOSIS — Z309 Encounter for contraceptive management, unspecified: Secondary | ICD-10-CM | POA: Insufficient documentation

## 2021-04-02 DIAGNOSIS — E785 Hyperlipidemia, unspecified: Secondary | ICD-10-CM | POA: Insufficient documentation

## 2021-08-29 ENCOUNTER — Other Ambulatory Visit: Payer: Self-pay

## 2021-08-29 ENCOUNTER — Emergency Department (HOSPITAL_BASED_OUTPATIENT_CLINIC_OR_DEPARTMENT_OTHER): Payer: 59

## 2021-08-29 ENCOUNTER — Emergency Department (HOSPITAL_BASED_OUTPATIENT_CLINIC_OR_DEPARTMENT_OTHER)
Admission: EM | Admit: 2021-08-29 | Discharge: 2021-08-29 | Disposition: A | Discharge status: Home / Self Care | Payer: 59 | Attending: Emergency Medicine | Admitting: Emergency Medicine

## 2021-08-29 DIAGNOSIS — S60221A Contusion of right hand, initial encounter: Secondary | ICD-10-CM | POA: Insufficient documentation

## 2021-08-29 DIAGNOSIS — Z7982 Long term (current) use of aspirin: Secondary | ICD-10-CM | POA: Diagnosis not present

## 2021-08-29 DIAGNOSIS — S6991XA Unspecified injury of right wrist, hand and finger(s), initial encounter: Secondary | ICD-10-CM | POA: Diagnosis present

## 2021-08-29 DIAGNOSIS — X58XXXA Exposure to other specified factors, initial encounter: Secondary | ICD-10-CM | POA: Diagnosis not present

## 2021-08-29 MED ORDER — KETOROLAC TROMETHAMINE 60 MG/2ML IM SOLN
30.0000 mg | Freq: Once | INTRAMUSCULAR | Status: AC
  Administered 2021-08-29: 30 mg via INTRAMUSCULAR
  Filled 2021-08-29: qty 2

## 2021-08-29 NOTE — ED Provider Notes (Signed)
[facility] EMERGENCY DEPARTMENT Provider Note  CSN: [id number] Arrival date & time: 08/29/21 0124  Chief Complaint(s) Hand Pain  Pt c/o right hand pain after a 57lb package fell on her hand at work . Pt middle finger and ringer finger are slightly swollen HPI Marissa Hull is a 30 y.o. female    Hand Pain This is a new problem. The current episode started 1 to 2 hours ago. The problem occurs constantly. The problem has not changed since onset.Pertinent negatives include no chest pain, no abdominal pain, no headaches and no shortness of breath. The symptoms are aggravated by bending. Nothing relieves the symptoms. She has tried nothing for the symptoms.   Past Medical History No past medical history on file. Patient Active Problem List   Diagnosis Date Noted   TIA (transient ischemic attack) 03/26/2021   Home Medication(s) Prior to Admission medications   Medication Sig Start Date End Date Taking? Authorizing Provider  aspirin EC 81 MG EC tablet Take 1 tablet (81 mg total) by mouth daily. Swallow whole. 03/28/21   Hughie Closs, MD  escitalopram (LEXAPRO) 10 MG tablet Take 10 mg by mouth every morning. 03/19/21   [provider]  pseudoephedrine-acetaminophen (TYLENOL SINUS) 30-500 MG TABS tablet Take 1 tablet by mouth every 4 (four) hours as needed (cold symptoms).    [provider]  Sennosides (EX-LAX) 15 MG TABS Take 2 tablets by mouth daily as needed (constipation).    [provider]                                                                                                                                    Past Surgical History No past surgical history on file. Family History No family history on file.  Social History Social History   Tobacco Use   Smoking status: Never   Smokeless tobacco: Never  Vaping Use   Vaping Use: Never used  Substance Use Topics   Alcohol use: No   Drug use: Never   Allergies Patient has no known  allergies.  Review of Systems Review of Systems  Respiratory:  Negative for shortness of breath.   Cardiovascular:  Negative for chest pain.  Gastrointestinal:  Negative for abdominal pain.  Neurological:  Negative for headaches.  All other systems are reviewed and are negative for acute change except as noted in the HPI  Physical Exam Vital Signs  I have reviewed the triage vital signs BP (!) 136/100 (BP Location: Left Arm)   Pulse 77   Temp 98.4 F (36.9 C) (Oral)   Resp 16   Ht 5' (1.524 m)   Wt 65.8 kg   SpO2 100%   BMI 28.32 kg/m   Physical Exam Vitals reviewed.  Constitutional:      General: She is not in acute distress.    Appearance: She is well-developed. She is not diaphoretic.  HENT:     Head:  Normocephalic and atraumatic.     Right Ear: External ear normal.     Left Ear: External ear normal.     Nose: Nose normal.  Eyes:     General: No scleral icterus.    Conjunctiva/sclera: Conjunctivae normal.  Neck:     Trachea: Phonation normal.  Cardiovascular:     Rate and Rhythm: Normal rate and regular rhythm.  Pulmonary:     Effort: Pulmonary effort is normal. No respiratory distress.     Breath sounds: No stridor.  Abdominal:     General: There is no distension.  Musculoskeletal:        General: Normal range of motion.     Right hand: Normal pulse.     Left hand: Swelling (mild swelling to distal portion of middle and ring fingers) and tenderness present. No deformity or lacerations. Normal range of motion. Normal strength. Normal sensation. Normal pulse.     Cervical back: Normal range of motion.     Comments: Compartments soft  Neurological:     Mental Status: She is alert and oriented to person, place, and time.  Psychiatric:        Behavior: Behavior normal.    ED Results and Treatments Labs (all labs ordered are listed, but only abnormal results are displayed) Labs Reviewed - No data to display                                                                                                                        EKG  EKG Interpretation  Date/Time:    Ventricular Rate:    PR Interval:    QRS Duration:   QT Interval:    QTC Calculation:   R Axis:     Text Interpretation:         Radiology DG Hand Complete Right  Result Date: 08/29/2021 CLINICAL DATA:  Hand injury EXAM: RIGHT HAND - COMPLETE 3+ VIEW COMPARISON:  None. FINDINGS: There is no evidence of fracture or dislocation. There is no evidence of arthropathy or other focal bone abnormality. No foreign body. IMPRESSION: Negative. Electronically Signed   By: Wiliam Ke M.D.   On: 08/29/2021 02:00    Pertinent labs & imaging results that were available during my care of the patient were reviewed by me and considered in my medical decision making (see MDM for details).  Medications Ordered in ED Medications  ketorolac (TORADOL) injection 30 mg (has no administration in time range)  Procedures Procedures  (including critical care time)  Medical Decision Making / ED Course I have reviewed the nursing notes for this encounter and the patient's prior records (if available in EHR or on provided paperwork).  Marissa Hull was evaluated in Emergency Department on 08/29/2021 for the symptoms described in the history of present illness. She was evaluated in the context of the global COVID-19 pandemic, which necessitated consideration that the patient might be at risk for infection with the SARS-CoV-2 virus that causes COVID-19. Institutional protocols and algorithms that pertain to the evaluation of patients at risk for COVID-19 are in a state of rapid change based on information released by regulatory bodies including the CDC and federal and state organizations. These policies and algorithms were followed during the patient's care in the ED.      Hand contusion. No wounds or laceration. Plain film w/o fracture or dislocation. No evidence concerning for compartment syndrome. IM toradol for pain. Supportive management recommended. PCP follow up.  Pertinent labs & imaging results that were available during my care of the patient were reviewed by me and considered in my medical decision making:    Final Clinical Impression(s) / ED Diagnoses Final diagnoses:  Contusion of right hand, initial encounter   The patient appears reasonably screened and/or stabilized for discharge and I doubt any other medical condition or other [facility] requiring further screening, evaluation, or treatment in the ED at this time prior to discharge. Safe for discharge with strict return precautions.  Disposition: Discharge  Condition: Good  I have discussed the results, Dx and Tx plan with the patient/family who expressed understanding and agree(s) with the plan. Discharge instructions discussed at length. The patient/family was given strict return precautions who verbalized understanding of the instructions. No further questions at time of discharge.    ED Discharge Orders     None       Follow Up: [facility], [facility] [address] [place] [address] [phone]  Call  to schedule an appointment for close follow up    This chart was dictated using voice recognition software.  Despite best efforts to proofread,  errors can occur which can change the documentation meaning.    Nira Conn, MD 08/29/21 [id number]

## 2021-08-29 NOTE — ED Triage Notes (Signed)
Pt c/o right hand pain after a 57lb package fell on her hand at work . Pt middle finger and ringer finger are slightly swollen.

## 2022-01-17 DIAGNOSIS — Z5941 Food insecurity: Secondary | ICD-10-CM | POA: Insufficient documentation

## 2022-01-18 DIAGNOSIS — E663 Overweight: Secondary | ICD-10-CM | POA: Insufficient documentation

## 2022-02-22 ENCOUNTER — Other Ambulatory Visit: Payer: Self-pay

## 2022-02-22 DIAGNOSIS — R1110 Vomiting, unspecified: Secondary | ICD-10-CM | POA: Insufficient documentation

## 2022-02-22 DIAGNOSIS — Z5321 Procedure and treatment not carried out due to patient leaving prior to being seen by health care provider: Secondary | ICD-10-CM | POA: Diagnosis not present

## 2022-02-22 DIAGNOSIS — R1010 Upper abdominal pain, unspecified: Secondary | ICD-10-CM | POA: Insufficient documentation

## 2022-02-22 DIAGNOSIS — R1030 Lower abdominal pain, unspecified: Secondary | ICD-10-CM | POA: Diagnosis present

## 2022-02-23 ENCOUNTER — Encounter (HOSPITAL_BASED_OUTPATIENT_CLINIC_OR_DEPARTMENT_OTHER): Payer: Self-pay | Admitting: *Deleted

## 2022-02-23 ENCOUNTER — Emergency Department (HOSPITAL_BASED_OUTPATIENT_CLINIC_OR_DEPARTMENT_OTHER)
Admission: EM | Admit: 2022-02-23 | Discharge: 2022-02-23 | Discharge status: Against Medical Advice | Payer: 59 | Attending: Emergency Medicine | Admitting: Emergency Medicine

## 2022-02-23 HISTORY — DX: Transient cerebral ischemic attack, unspecified: G45.9

## 2022-02-23 NOTE — ED Triage Notes (Signed)
Pt. Reports pain in the R side after eating tonight. She had spicy chicken sandwich.  Pt. Reports she vomited x 4 and still hurts 10/10 in the R upper and lower abd.  No diarrhea  per Pt. Only abd. Pain.  Pt. Said she feels like she needs to pass gas and can't.  ?

## 2022-04-20 IMAGING — MR MR MRA NECK W/O CM
14 of 18 series · 34 of 48 positions shown · non-contrast
Comparison: Head CT 03/26/2021

CLINICAL DATA: Sudden onset right arm numbness/weakness and vision
loss, now resolved.



[Series 5: DWI · axial · 3.0mm · 1.36mm/px · z∈[-61,+78]mm · 3 of 96 slices shown (1 of 2)]
[im 1/96]
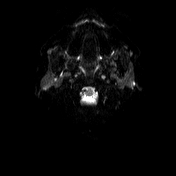
[im 48/96]
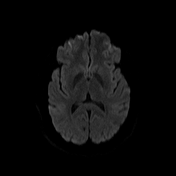
[im 96/96]
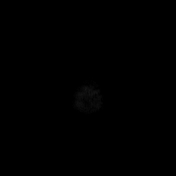

[Series 6: DWI · axial · 3.0mm · 1.36mm/px · z∈[-61,+78]mm · 2 of 48 slices shown (2 of 2)]
[im 1/48]
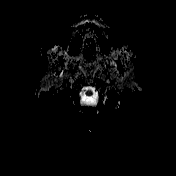
[im 48/48]
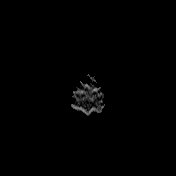

[Series 7: T1 · sagittal · 5.0mm · 0.75mm/px · 1 of 22 slices shown (1 of 2)]
[im 1/22]
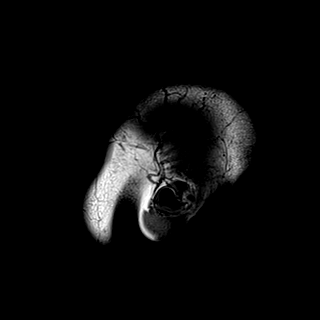

[Series 8: T2 · axial · 5.0mm · 0.62mm/px · 1 of 26 slices shown (1 of 2)]
[im 1/26]
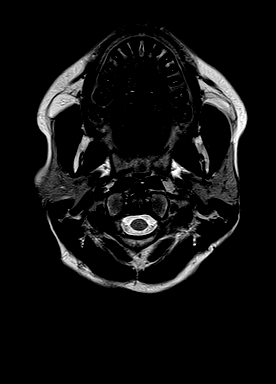

[Series 10: swi_images · axial · 3.0mm · 0.75mm/px · z∈[-59,+93]mm · 2 of 52 slices shown]
[im 1/52]
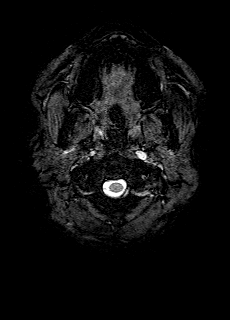
[im 52/52]
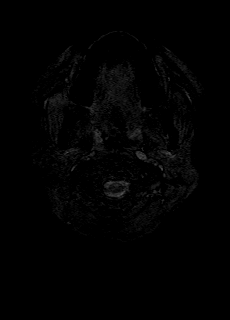

[Series 11: FLAIR · axial · 3.0mm · 0.75mm/px · z∈[-59,+93]mm · 2 of 52 slices shown (1 of 2)]
[im 1/52]
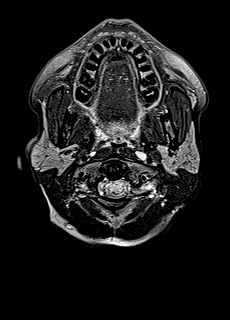
[im 52/52]
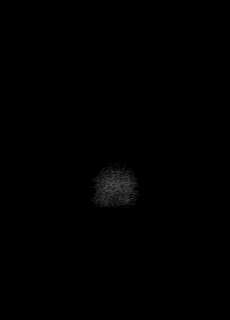

[Series 12: T1 · axial · 1.0mm · 0.94mm/px · z∈[-63,+96]mm · 7 of 160 slices shown (2 of 2)]
[im 1/160]
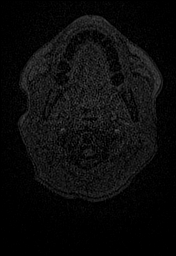
[im 27/160]
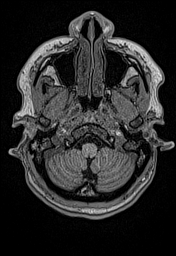
[im 54/160]
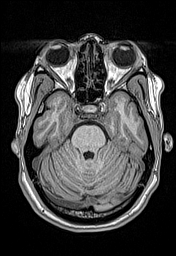
[im 80/160]
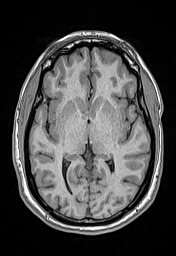
[im 107/160]
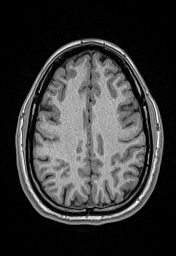
[im 133/160]
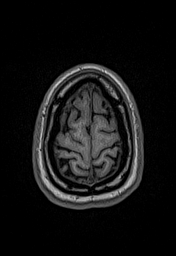
[im 160/160]
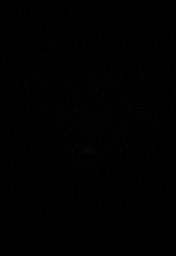

[Series 13: cor dwi_tracew · coronal · 5.0mm · 1.53mm/px · 2 of 50 slices shown]
[im 1/50]
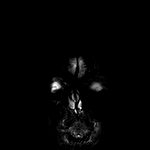
[im 50/50]
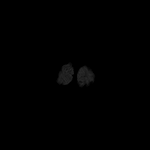

[Series 14: cor dwi_adc · coronal · 5.0mm · 1.53mm/px · 1 of 25 slices shown]
[im 1/25]
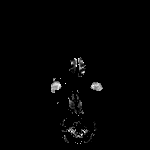

[Series 15: T2 · coronal · 5.0mm · 0.57mm/px · 2 of 33 slices shown (2 of 2)]
[im 1/33]
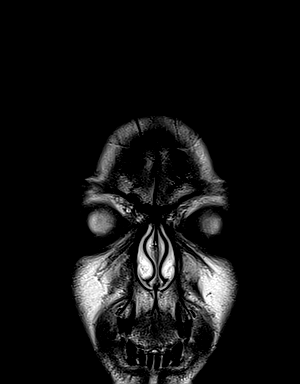
[im 33/33]
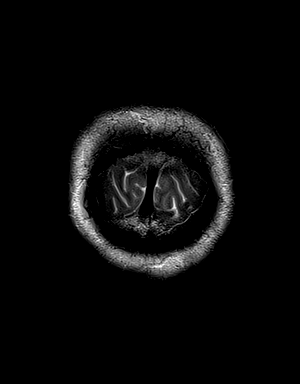

[Series 25: tof_2d_tra · axial · 3.5mm · 0.43mm/px · z∈[-200,-19]mm · 3 of 75 slices shown]
[im 1/75]
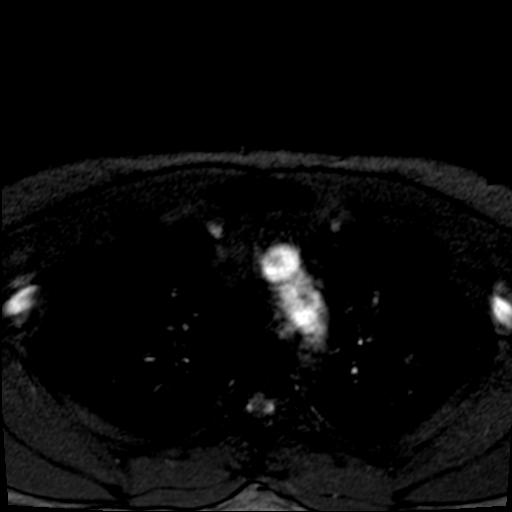
[im 38/75]
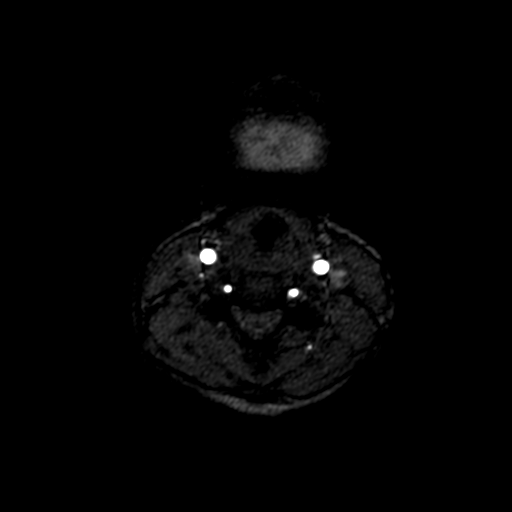
[im 75/75]
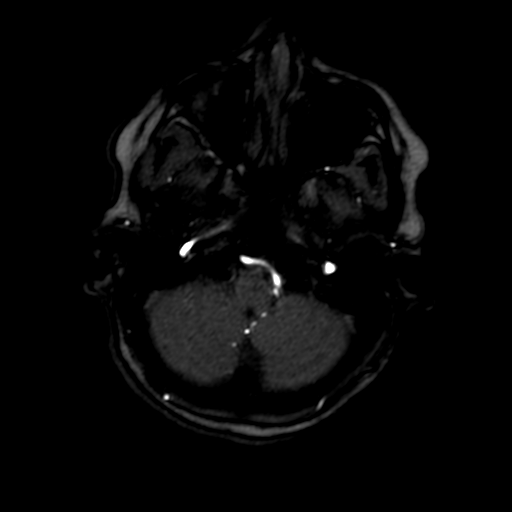

[Series 29: tof_2d_tra_mip_tra · axial · 184.8mm · 0.43mm/px · 1 of 1 slices shown]
[im 1/1]
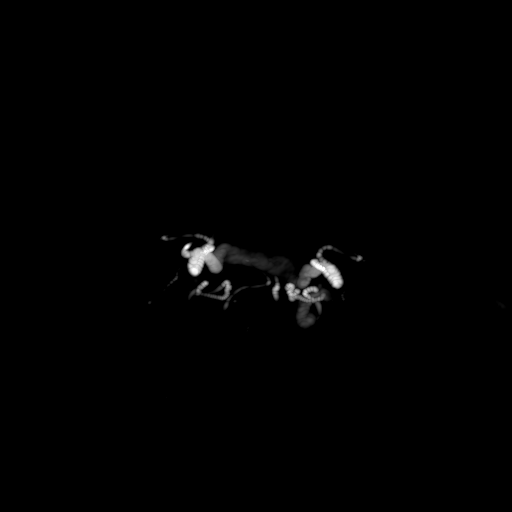

[Series 30: tof_3d_multi-slab-bifurcation · axial · 1.0mm · 0.26mm/px · z∈[-176,-151]mm · 2 of 104 slices shown]
[im 1/104]
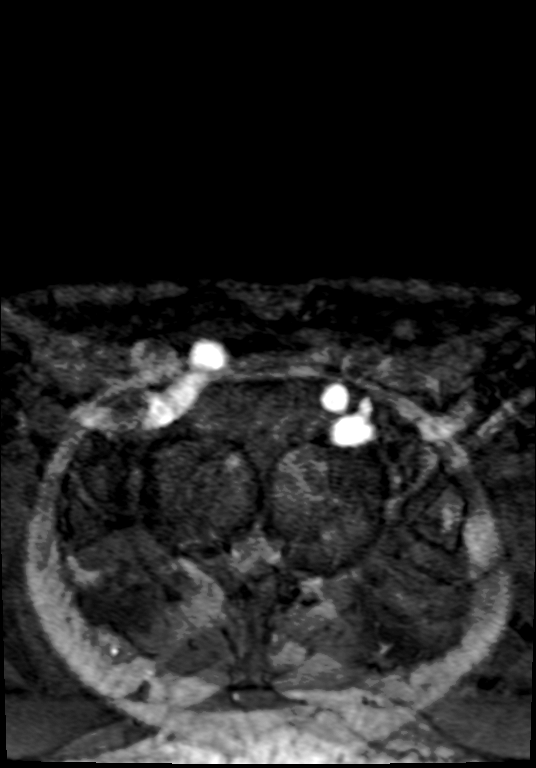
[im 26/104]
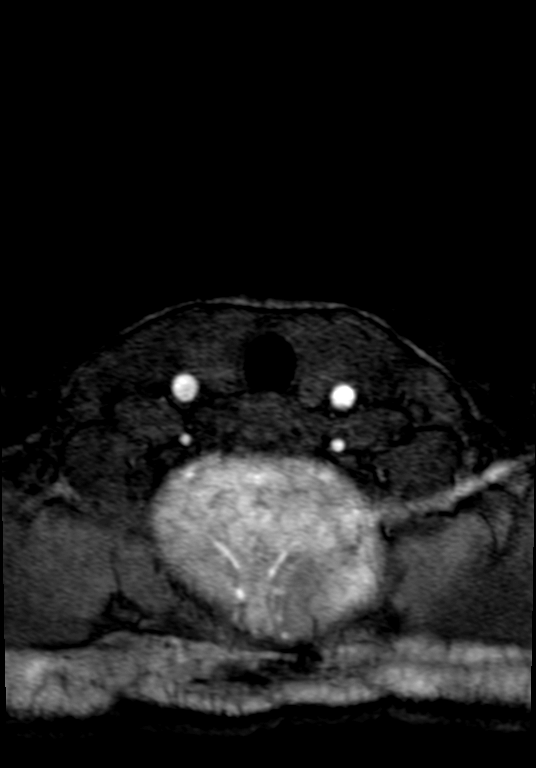

[Series 37: FLAIR · sagittal · 1.1mm · 0.50mm/px · 5 of 104 slices shown (2 of 2)]
[im 1/104]
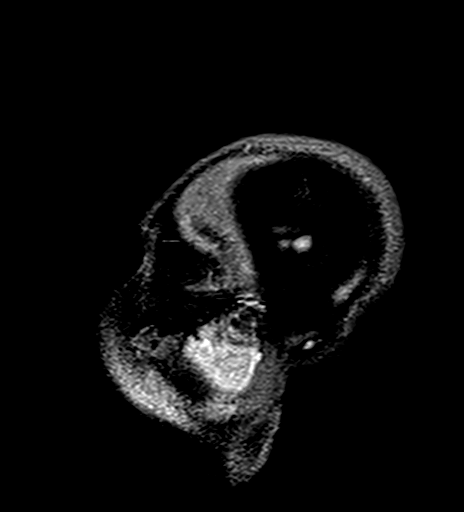
[im 26/104]
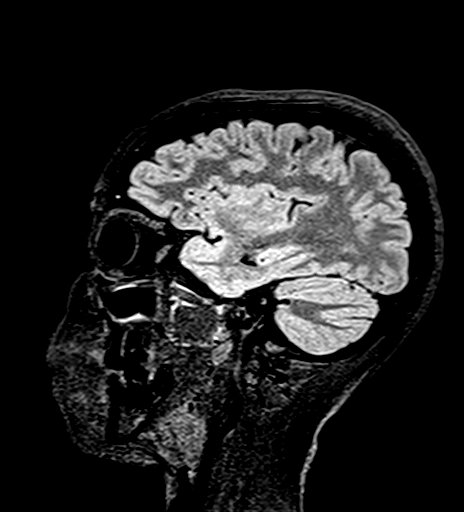
[im 52/104]
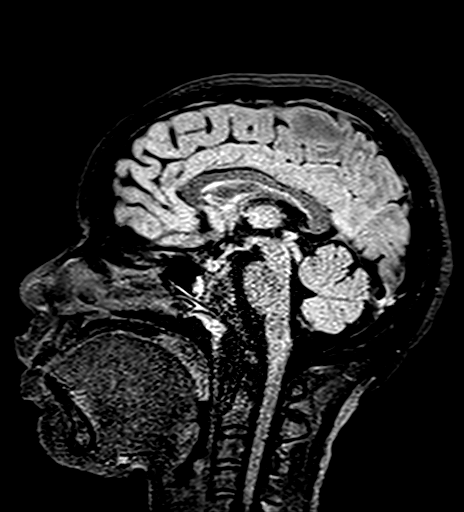
[im 78/104]
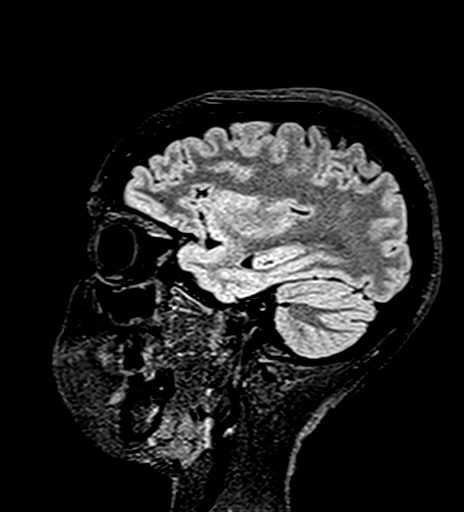
[im 104/104]
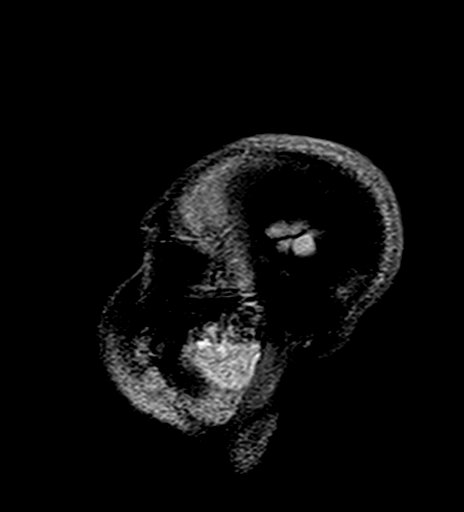

[34 of 48 positions shown; findings below may reference images not displayed]

FINDINGS: MRI HEAD FINDINGS

Brain: There is no evidence of an acute infarct, intracranial
hemorrhage, mass, midline shift, or extra-axial fluid collection.
The ventricles and sulci are normal. The brain is normal in signal.

Vascular: Major intracranial vascular flow voids are preserved.

Skull and upper cervical spine: Unremarkable bone marrow signal.

Sinuses/Orbits: Unremarkable orbits. Mild mucosal thickening in the
paranasal sinuses. Small mucous retention cyst in the left maxillary
sinus. Clear mastoid air cells.

Other: None.

MRA HEAD FINDINGS

The intracranial vertebral arteries are widely patent to the basilar
with the left being dominant. Patent PICA, AICA, and SCA origins are
seen bilaterally. The basilar artery is widely patent. There are
right larger than left posterior communicating arteries. Both PCAs
are patent without evidence of a significant proximal stenosis.

The internal carotid arteries are widely patent from skull base to
carotid termini. ACAs and MCAs are patent without evidence of a
proximal branch occlusion or significant proximal stenosis. No
aneurysm is identified.

MRA NECK FINDINGS

There is a normal variant aortic arch branching pattern with common
origin of the brachiocephalic and left common carotid arteries. The
common carotid and cervical internal carotid arteries are patent
without evidence of stenosis or dissection.

The vertebral arteries are patent with antegrade flow bilaterally
and with the left being mildly dominant. No vertebral artery
stenosis or dissection is evident.
IMPRESSION: 1. Unremarkable appearance of the brain.
2. Negative head MRA.
3. Negative neck MRA.

## 2022-04-20 IMAGING — MR MR MRA HEAD W/O CM
14 of 18 series · 34 of 48 positions shown · non-contrast
Comparison: Head CT 03/26/2021

CLINICAL DATA: Sudden onset right arm numbness/weakness and vision
loss, now resolved.



[Series 5: DWI · axial · 3.0mm · 1.36mm/px · z∈[-61,+78]mm · 3 of 96 slices shown (1 of 2)]
[im 1/96]
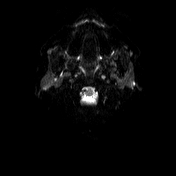
[im 48/96]
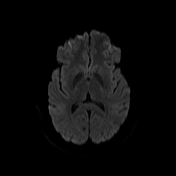
[im 96/96]
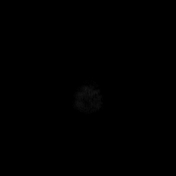

[Series 6: DWI · axial · 3.0mm · 1.36mm/px · z∈[-61,+78]mm · 2 of 48 slices shown (2 of 2)]
[im 1/48]
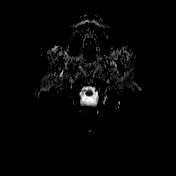
[im 48/48]
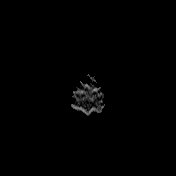

[Series 7: T1 · sagittal · 5.0mm · 0.75mm/px · 1 of 22 slices shown (1 of 2)]
[im 1/22]
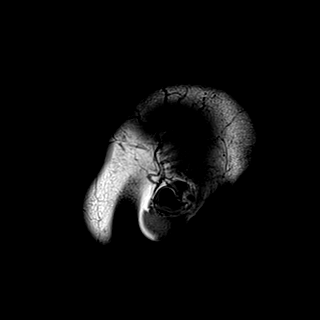

[Series 8: T2 · axial · 5.0mm · 0.62mm/px · 1 of 26 slices shown (1 of 2)]
[im 1/26]
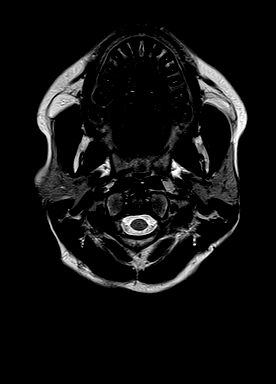

[Series 10: swi_images · axial · 3.0mm · 0.75mm/px · z∈[-59,+93]mm · 2 of 52 slices shown]
[im 1/52]
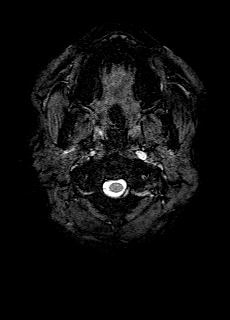
[im 52/52]
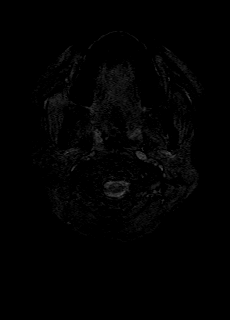

[Series 11: FLAIR · axial · 3.0mm · 0.75mm/px · z∈[-59,+93]mm · 2 of 52 slices shown (1 of 2)]
[im 1/52]
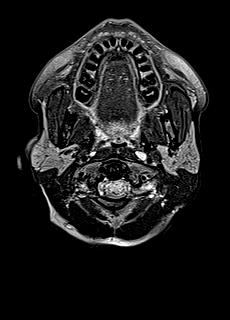
[im 52/52]
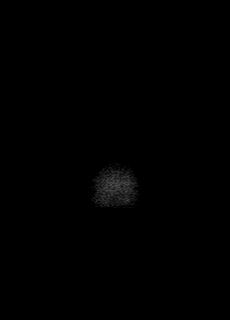

[Series 12: T1 · axial · 1.0mm · 0.94mm/px · z∈[-63,+96]mm · 7 of 160 slices shown (2 of 2)]
[im 1/160]
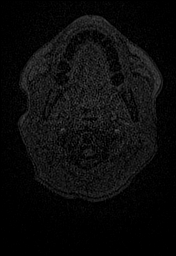
[im 27/160]
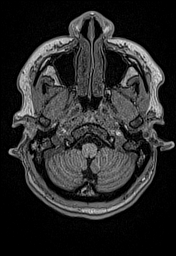
[im 54/160]
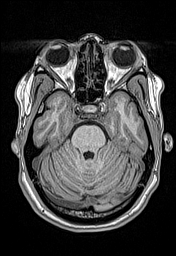
[im 80/160]
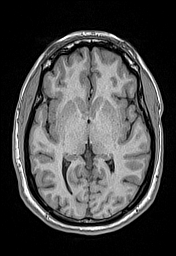
[im 107/160]
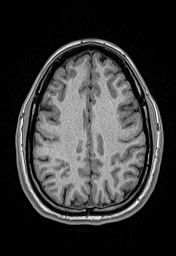
[im 133/160]
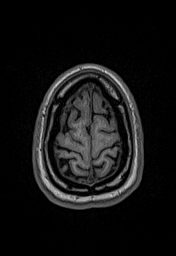
[im 160/160]
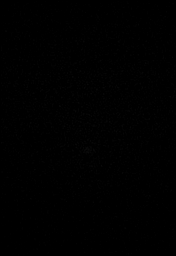

[Series 13: cor dwi_tracew · coronal · 5.0mm · 1.53mm/px · 2 of 50 slices shown]
[im 1/50]
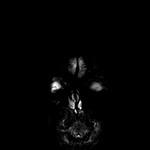
[im 50/50]
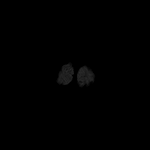

[Series 14: cor dwi_adc · coronal · 5.0mm · 1.53mm/px · 1 of 25 slices shown]
[im 1/25]
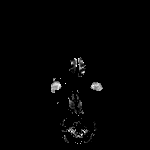

[Series 15: T2 · coronal · 5.0mm · 0.57mm/px · 2 of 33 slices shown (2 of 2)]
[im 1/33]
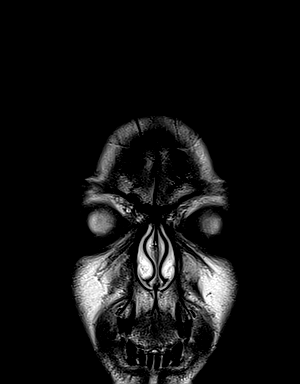
[im 33/33]
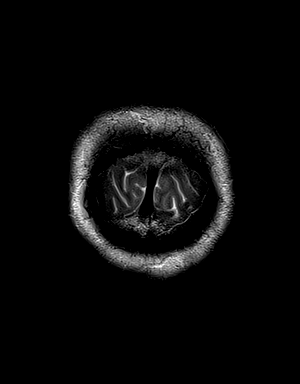

[Series 25: tof_2d_tra · axial · 3.5mm · 0.43mm/px · z∈[-200,-19]mm · 3 of 75 slices shown]
[im 1/75]
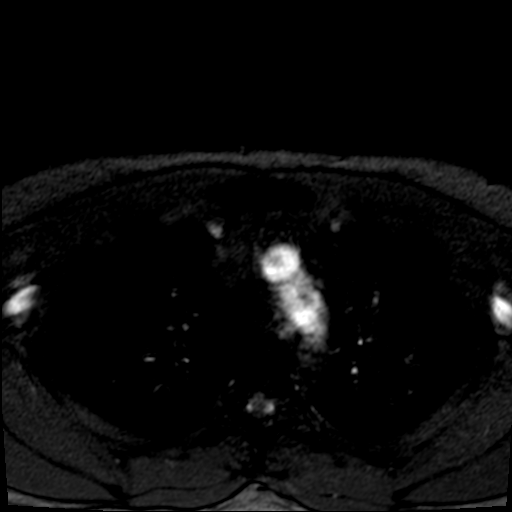
[im 38/75]
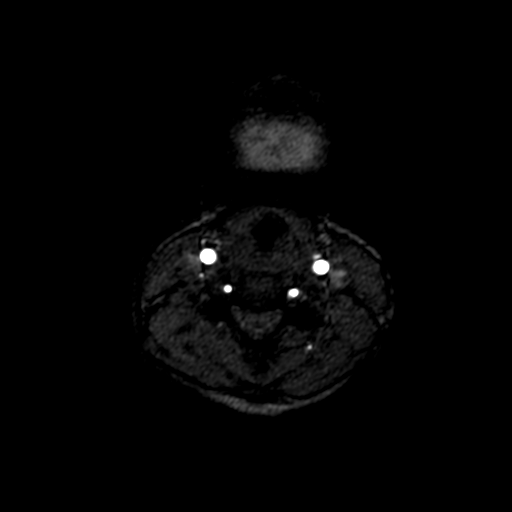
[im 75/75]
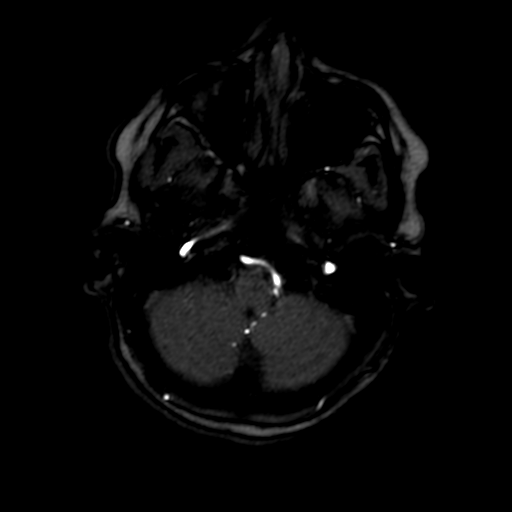

[Series 29: tof_2d_tra_mip_tra · axial · 184.8mm · 0.43mm/px · 1 of 1 slices shown]
[im 1/1]
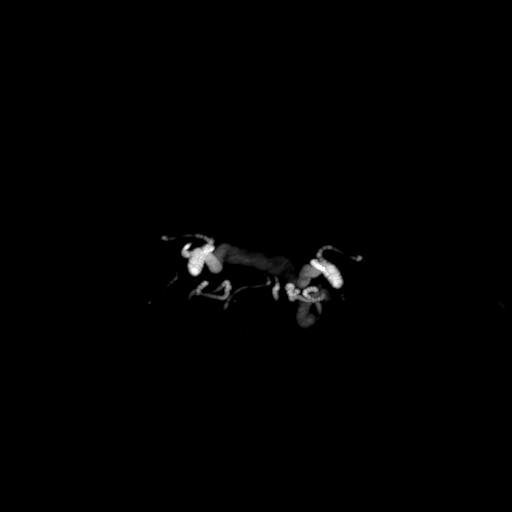

[Series 30: tof_3d_multi-slab-bifurcation · axial · 1.0mm · 0.26mm/px · z∈[-176,-151]mm · 2 of 104 slices shown]
[im 1/104]
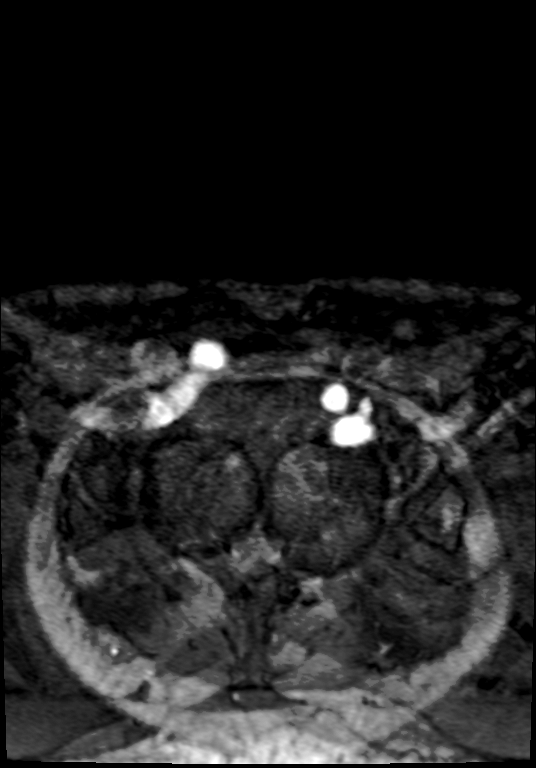
[im 26/104]
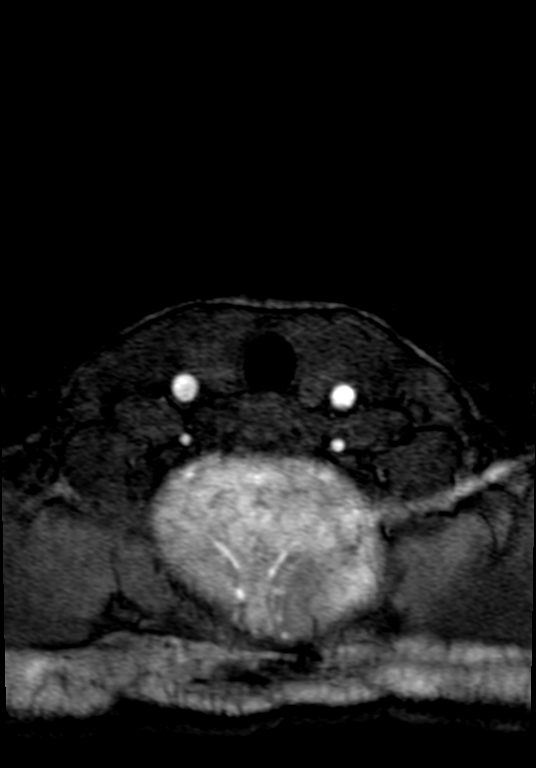

[Series 37: FLAIR · sagittal · 1.1mm · 0.50mm/px · 5 of 104 slices shown (2 of 2)]
[im 1/104]
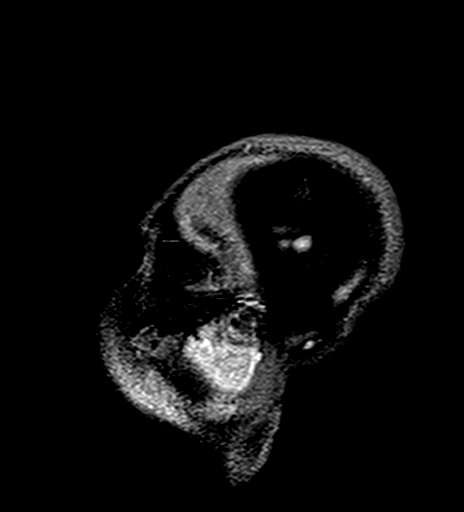
[im 26/104]
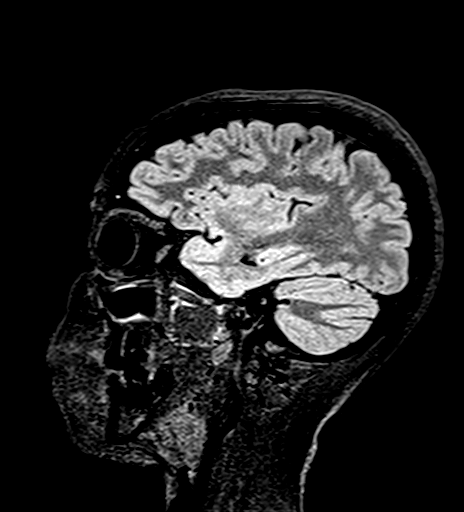
[im 52/104]
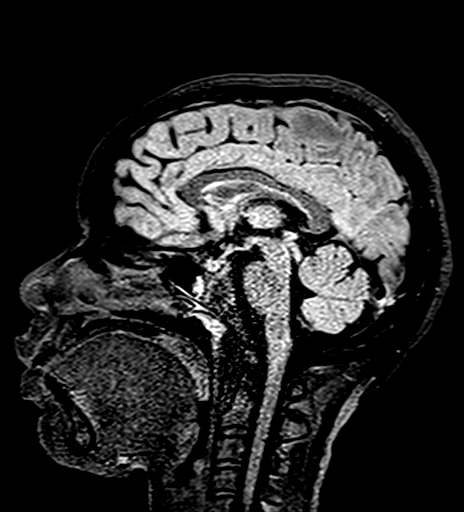
[im 78/104]
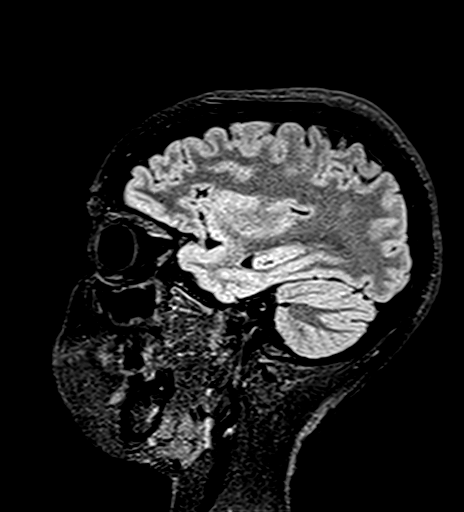
[im 104/104]
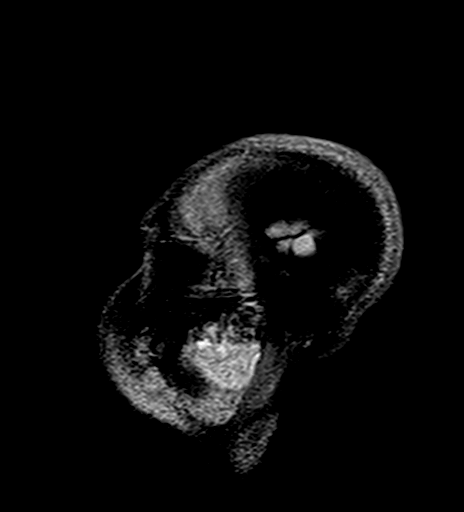

[34 of 48 positions shown; findings below may reference images not displayed]

FINDINGS: MRI HEAD FINDINGS

Brain: There is no evidence of an acute infarct, intracranial
hemorrhage, mass, midline shift, or extra-axial fluid collection.
The ventricles and sulci are normal. The brain is normal in signal.

Vascular: Major intracranial vascular flow voids are preserved.

Skull and upper cervical spine: Unremarkable bone marrow signal.

Sinuses/Orbits: Unremarkable orbits. Mild mucosal thickening in the
paranasal sinuses. Small mucous retention cyst in the left maxillary
sinus. Clear mastoid air cells.

Other: None.

MRA HEAD FINDINGS

The intracranial vertebral arteries are widely patent to the basilar
with the left being dominant. Patent PICA, AICA, and SCA origins are
seen bilaterally. The basilar artery is widely patent. There are
right larger than left posterior communicating arteries. Both PCAs
are patent without evidence of a significant proximal stenosis.

The internal carotid arteries are widely patent from skull base to
carotid termini. ACAs and MCAs are patent without evidence of a
proximal branch occlusion or significant proximal stenosis. No
aneurysm is identified.

MRA NECK FINDINGS

There is a normal variant aortic arch branching pattern with common
origin of the brachiocephalic and left common carotid arteries. The
common carotid and cervical internal carotid arteries are patent
without evidence of stenosis or dissection.

The vertebral arteries are patent with antegrade flow bilaterally
and with the left being mildly dominant. No vertebral artery
stenosis or dissection is evident.
IMPRESSION: 1. Unremarkable appearance of the brain.
2. Negative head MRA.
3. Negative neck MRA.

## 2022-04-30 ENCOUNTER — Other Ambulatory Visit: Payer: Self-pay

## 2022-04-30 ENCOUNTER — Encounter (HOSPITAL_BASED_OUTPATIENT_CLINIC_OR_DEPARTMENT_OTHER): Payer: Self-pay

## 2022-04-30 ENCOUNTER — Emergency Department (HOSPITAL_BASED_OUTPATIENT_CLINIC_OR_DEPARTMENT_OTHER): Payer: 59

## 2022-04-30 ENCOUNTER — Emergency Department (HOSPITAL_BASED_OUTPATIENT_CLINIC_OR_DEPARTMENT_OTHER)
Admission: EM | Admit: 2022-04-30 | Discharge: 2022-04-30 | Disposition: A | Discharge status: Home / Self Care | Payer: 59 | Attending: Emergency Medicine | Admitting: Emergency Medicine

## 2022-04-30 DIAGNOSIS — M7989 Other specified soft tissue disorders: Secondary | ICD-10-CM | POA: Insufficient documentation

## 2022-04-30 DIAGNOSIS — R202 Paresthesia of skin: Secondary | ICD-10-CM | POA: Insufficient documentation

## 2022-04-30 DIAGNOSIS — M79609 Pain in unspecified limb: Secondary | ICD-10-CM

## 2022-04-30 DIAGNOSIS — R531 Weakness: Secondary | ICD-10-CM | POA: Diagnosis present

## 2022-04-30 DIAGNOSIS — Z7982 Long term (current) use of aspirin: Secondary | ICD-10-CM | POA: Diagnosis not present

## 2022-04-30 LAB — COMPREHENSIVE METABOLIC PANEL
ALT: 12 U/L (ref 0–44)
AST: 18 U/L (ref 15–41)
Albumin: 3.7 g/dL (ref 3.5–5.0)
Alkaline Phosphatase: 33 U/L — ABNORMAL LOW (ref 38–126)
Anion gap: 7 (ref 5–15)
BUN: 12 mg/dL (ref 6–20)
CO2: 26 mmol/L (ref 22–32)
Calcium: 8.7 mg/dL — ABNORMAL LOW (ref 8.9–10.3)
Chloride: 104 mmol/L (ref 98–111)
Creatinine, Ser: 0.81 mg/dL (ref 0.44–1.00)
GFR, Estimated: >60 mL/min (ref >60)
Glucose, Bld: 96 mg/dL (ref 70–99)
Potassium: 3.2 mmol/L — ABNORMAL LOW (ref 3.5–5.1)
Sodium: 137 mmol/L (ref 135–145)
Total Bilirubin: 0.3 mg/dL (ref 0.3–1.2)
Total Protein: 7 g/dL (ref 6.5–8.1)

## 2022-04-30 LAB — DIFFERENTIAL
Abs Immature Granulocytes: 0.02 10*3/uL (ref 0.00–0.07)
Basophils Absolute: 0 10*3/uL (ref 0.0–0.1)
Basophils Relative: 1 %
Eosinophils Absolute: 0.1 10*3/uL (ref 0.0–0.5)
Eosinophils Relative: 1 %
Immature Granulocytes: 0 %
Lymphocytes Relative: 42 %
Lymphs Abs: 2.4 10*3/uL (ref 0.7–4.0)
Monocytes Absolute: 0.3 10*3/uL (ref 0.1–1.0)
Monocytes Relative: 5 %
Neutro Abs: 2.9 10*3/uL (ref 1.7–7.7)
Neutrophils Relative %: 51 %

## 2022-04-30 LAB — CBC
HCT: 35.8 % — ABNORMAL LOW (ref 36.0–46.0)
Hemoglobin: 12.3 g/dL (ref 12.0–15.0)
MCH: 31.1 pg (ref 26.0–34.0)
MCHC: 34.4 g/dL (ref 30.0–36.0)
MCV: 90.4 fL (ref 80.0–100.0)
Platelets: 309 10*3/uL (ref 150–400)
RBC: 3.96 MIL/uL (ref 3.87–5.11)
RDW: 12.8 % (ref 11.5–15.5)
WBC: 5.7 10*3/uL (ref 4.0–10.5)
nRBC: 0 % (ref 0.0–0.2)

## 2022-04-30 LAB — PROTIME-INR
INR: 1.2 (ref 0.8–1.2)
Prothrombin Time: 14.8 seconds (ref 11.4–15.2)

## 2022-04-30 LAB — APTT: aPTT: 26 seconds (ref 24–36)

## 2022-04-30 LAB — PREGNANCY, URINE: Preg Test, Ur: NEGATIVE

## 2022-04-30 LAB — ETHANOL: Alcohol, Ethyl (B): <10 mg/dL (ref <10)

## 2022-04-30 MED ORDER — SODIUM CHLORIDE 0.9% FLUSH
3.0000 mL | Freq: Once | INTRAVENOUS | Status: AC
  Administered 2022-04-30: 3 mL via INTRAVENOUS
  Filled 2022-04-30: qty 3

## 2022-04-30 MED ORDER — NAPROXEN 375 MG PO TABS: 375.0000 mg | ORAL_TABLET | Freq: Two times a day (BID) | ORAL | 0 refills | Status: AC

## 2022-04-30 NOTE — ED Triage Notes (Signed)
Pt reports R arm pain and swelling around 7:30 tonight. 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 Stroke scale negative.

## 2022-04-30 NOTE — ED Provider Notes (Signed)
[facility] DEPARTMENT Provider Note   CSN: [id number] Arrival date & time: 04/30/22  2047     History  Chief Complaint  Patient presents with  . Extremity Weakness    Marissa Hull is a 31 y.o. female.   Extremity Weakness   Patient has a prior episode of admission to the hospital for possible TIA stroke.  Neurology ultimately felt she had possibly a complex migraine.  Patient's imaging did not show any evidence of stroke.  Patient states this evening she started to feel like her hand was swollen.  Patient was more swollen earlier and now seems to be somewhat better it then started to feel asleep and numb on her whole right arm.  She has not had any trouble with weakness or numbness in her leg.  No trouble with any facial droop.  She has had some mild discomfort in her neck but no severe pain in her neck or arm.  Patient denies any headache    Home Medications Prior to Admission medications   Medication Sig Start Date End Date Taking? Authorizing Provider  naproxen (NAPROSYN) 375 MG tablet Take 1 tablet (375 mg total) by mouth 2 (two) times daily. 04/30/22  Yes Dorie Rank, MD  aspirin EC 81 MG EC tablet Take 1 tablet (81 mg total) by mouth daily. Swallow whole. 03/28/21   Darliss Cheney, MD  escitalopram (LEXAPRO) 10 MG tablet Take 10 mg by mouth every morning. 03/19/21   [provider]  pseudoephedrine-acetaminophen (TYLENOL SINUS) 30-500 MG TABS tablet Take 1 tablet by mouth every 4 (four) hours as needed (cold symptoms).    [provider]  Sennosides (EX-LAX) 15 MG TABS Take 2 tablets by mouth daily as needed (constipation).    [provider]      Allergies    Patient has no known allergies.    Review of Systems   Review of Systems  Musculoskeletal:  Positive for extremity weakness.    Physical Exam Updated Vital Signs BP 117/79 (BP Location: Left Arm)   Pulse 75   Temp 98.9 F (37.2 C) (Oral)   Resp 16   Ht 1.524 m  (5')   Wt 63.5 kg   SpO2 99%   BMI 27.34 kg/m  Physical Exam Vitals and nursing note reviewed.  Constitutional:      General: She is not in acute distress.    Appearance: She is well-developed.  HENT:     Head: Normocephalic and atraumatic.     Right Ear: External ear normal.     Left Ear: External ear normal.  Eyes:     General: No scleral icterus.       Right eye: No discharge.        Left eye: No discharge.     Conjunctiva/sclera: Conjunctivae normal.  Neck:     Trachea: No tracheal deviation.     Comments: Mild tenderness palpation right paraspinal region Cardiovascular:     Rate and Rhythm: Normal rate and regular rhythm.  Pulmonary:     Effort: Pulmonary effort is normal. No respiratory distress.     Breath sounds: Normal breath sounds. No stridor. No wheezing or rales.  Abdominal:     General: Bowel sounds are normal. There is no distension.     Palpations: Abdomen is soft.     Tenderness: There is no abdominal tenderness. There is no guarding or rebound.  Musculoskeletal:        General: No tenderness.  Cervical back: Neck supple.     Comments: Strong radial pulse, no swelling noted bilateral upper extremities or lower extremities  Skin:    General: Skin is warm and dry.     Findings: No rash.  Neurological:     Mental Status: She is alert and oriented to person, place, and time.     Cranial Nerves: No cranial nerve deficit.     Sensory: No sensory deficit.     Motor: No abnormal muscle tone or seizure activity.     Coordination: Coordination normal.     Comments: No pronator drift bilateral upper extrem, able to hold both legs off bed for 5 seconds, sensation intact in all extremities, no visual field cuts, no left or right sided neglect, normal finger-nose exam bilaterally, no nystagmus noted  No facial droop, extraocular movements intact, tongue midline  Grip strength slightly weaker on the right side, sensation intact but subjectively she feels like her  right arm is different     ED Results / Procedures / Treatments   Labs (all labs ordered are listed, but only abnormal results are displayed) Labs Reviewed  CBC - Abnormal; Notable for the following components:      Result Value   HCT 35.8 (*)    All other components within normal limits  COMPREHENSIVE METABOLIC PANEL - Abnormal; Notable for the following components:   Potassium 3.2 (*)    Calcium 8.7 (*)    Alkaline Phosphatase 33 (*)    All other components within normal limits  PROTIME-INR  APTT  DIFFERENTIAL  ETHANOL  PREGNANCY, URINE  CBG MONITORING, ED    EKG EKG Interpretation  Date/Time:  Tuesday April 30 2022 21:01:44 EDT Ventricular Rate:  79 PR Interval:  158 QRS Duration: 86 QT Interval:  366 QTC Calculation: 419 R Axis:   86 Text Interpretation: Normal sinus rhythm Normal ECG When compared with ECG of 26-Mar-2021 02:31, No significant change since last tracing Confirmed by Linwood Dibbles [id number]) on 04/30/2022 9:14:06 PM  Radiology CT HEAD WO CONTRAST  Result Date: 04/30/2022 CLINICAL DATA:  Transient ischemic attack. EXAM: CT HEAD WITHOUT CONTRAST TECHNIQUE: Contiguous axial images were obtained from the base of the skull through the vertex without intravenous contrast. RADIATION DOSE REDUCTION: This exam was performed according to the departmental dose-optimization program which includes automated exposure control, adjustment of the mA and/or kV according to patient size and/or use of iterative reconstruction technique. COMPARISON:  Head CT dated 03/26/2021. FINDINGS: Brain: The ventricles and sulci are appropriate size for the patient's age. The gray-white matter discrimination is preserved. There is no acute intracranial hemorrhage. No mass effect or midline shift. No extra-axial fluid collection. Vascular: No hyperdense vessel or unexpected calcification. Skull: Normal. Negative for fracture or focal lesion. Sinuses/Orbits: No acute finding. Other: None IMPRESSION:  No acute intracranial pathology. Electronically Signed   By: Elgie Collard M.D.   On: 04/30/2022 21:32    Procedures Procedures    Medications Ordered in ED Medications  sodium chloride flush (NS) 0.9 % injection 3 mL (3 mLs Intravenous Given 04/30/22 2122)    ED Course/ Medical Decision Making/ A&P Clinical Course as of 04/30/22 2307  Tue Apr 30, 2022  2305 Comprehensive metabolic panel(!) Mild hypokalemia noted [JK]  2305 CBC(!) Normal [JK]  2305 CT HEAD WO CONTRAST CT scan images and radiology report reviewed.  No acute findings [JK]    Clinical Course User Index [JK] Linwood Dibbles, MD  Medical Decision Making Differential diagnosis includes but not limited to stroke, TIA, cervical radiculopathy  Amount and/or Complexity of Data Reviewed External Data Reviewed: radiology and notes.    Details: Reviewed neurology notes from patient's hospitalization.  Ultimately they did not feel that she had stroke or TIA.  Symptoms felt possibly related to complex migraine.  Patient had an MRI of the brain back in May 2022.  No acute abnormalities noted.  No stroke noted Labs: ordered. Decision-making details documented in ED Course. Radiology: ordered and independent interpretation performed.  Risk Prescription drug management.   Patient presents ED with complaints of numbness and swelling in her right arm.  Patient reported prior history of stroke although reviewing her discharge summary she was evaluated for stroke but fortunately did not have 1.  Patient denies any headache today so I doubt complex migraine.  Her symptoms are focused only in her right upper extremity.  She also felt that it was somewhat swollen earlier.  Not seeing any signs to suggest DVT or infection.  She does not have any arterial compromise.  She has had some mild neck discomfort so I wonder if there could be a cervical radiculopathy.  At this time I have very low suspicion for stroke or TIA.   I did discuss with her that I do not have MRI available at this facility but at this time I do not feel that she needs to have an emergent MRI.  Evaluation and diagnostic testing in the emergency department does not suggest an emergent condition requiring admission or immediate intervention beyond what has been performed at this time.  The patient is safe for discharge and has been instructed to return immediately for worsening symptoms, change in symptoms or any other concerns.          Final Clinical Impression(s) / ED Diagnoses Final diagnoses:  Paresthesia and pain of extremity    Rx / DC Orders ED Discharge Orders          Ordered    naproxen (NAPROSYN) 375 MG tablet  2 times daily        04/30/22 2304              Linwood Dibbles, MD 04/30/22 2307

## 2022-09-03 DIAGNOSIS — R1032 Left lower quadrant pain: Secondary | ICD-10-CM | POA: Insufficient documentation

## 2022-09-03 DIAGNOSIS — M79643 Pain in unspecified hand: Secondary | ICD-10-CM | POA: Insufficient documentation

## 2022-09-03 DIAGNOSIS — M549 Dorsalgia, unspecified: Secondary | ICD-10-CM | POA: Insufficient documentation

## 2023-05-01 ENCOUNTER — Other Ambulatory Visit: Payer: Self-pay

## 2023-05-01 ENCOUNTER — Emergency Department (HOSPITAL_BASED_OUTPATIENT_CLINIC_OR_DEPARTMENT_OTHER)
Admission: EM | Admit: 2023-05-01 | Discharge: 2023-05-01 | Disposition: A | Discharge status: Home / Self Care | Payer: Worker's Compensation | Attending: Emergency Medicine | Admitting: Emergency Medicine

## 2023-05-01 ENCOUNTER — Encounter (HOSPITAL_BASED_OUTPATIENT_CLINIC_OR_DEPARTMENT_OTHER): Payer: Self-pay | Admitting: Emergency Medicine

## 2023-05-01 ENCOUNTER — Emergency Department (HOSPITAL_BASED_OUTPATIENT_CLINIC_OR_DEPARTMENT_OTHER): Payer: Worker's Compensation

## 2023-05-01 DIAGNOSIS — W109XXA Fall (on) (from) unspecified stairs and steps, initial encounter: Secondary | ICD-10-CM | POA: Insufficient documentation

## 2023-05-01 DIAGNOSIS — Y990 Civilian activity done for income or pay: Secondary | ICD-10-CM | POA: Insufficient documentation

## 2023-05-01 DIAGNOSIS — S93402A Sprain of unspecified ligament of left ankle, initial encounter: Secondary | ICD-10-CM

## 2023-05-01 DIAGNOSIS — W19XXXA Unspecified fall, initial encounter: Secondary | ICD-10-CM

## 2023-05-01 DIAGNOSIS — S8002XA Contusion of left knee, initial encounter: Secondary | ICD-10-CM | POA: Diagnosis not present

## 2023-05-01 DIAGNOSIS — Z7982 Long term (current) use of aspirin: Secondary | ICD-10-CM | POA: Diagnosis not present

## 2023-05-01 DIAGNOSIS — M25572 Pain in left ankle and joints of left foot: Secondary | ICD-10-CM | POA: Diagnosis present

## 2023-05-01 MED ORDER — IBUPROFEN 400 MG PO TABS
600.0000 mg | ORAL_TABLET | Freq: Once | ORAL | Status: AC
  Administered 2023-05-01: 600 mg via ORAL
  Filled 2023-05-01: qty 1

## 2023-05-01 NOTE — ED Triage Notes (Signed)
Per pt was at work, carrying a box down stairs fell and hit right side knee of floor and rolled ankle. Around 0145

## 2023-05-01 NOTE — ED Provider Notes (Signed)
  [place] [facility] Provider Note   CSN: [id number] Arrival date & time: 05/01/23  0302     History  Chief Complaint  Patient presents with   Marletta Lor    Marissa Hull is a 32 y.o. female.  Patient is a 32 year old female presenting with complaints of left knee and ankle injury.  She was walking down some stairs carrying a box when she bumped her knee, then fell and rolled her ankle.  She complains of pain in the knee and ankle.  No other injury.  Pain is worse with weightbearing.  No alleviating factors.  The history is provided by the patient.       Home Medications Prior to Admission medications   Medication Sig Start Date End Date Taking? Authorizing Provider  aspirin EC 81 MG EC tablet Take 1 tablet (81 mg total) by mouth daily. Swallow whole. 03/28/21   Hughie Closs, MD  escitalopram (LEXAPRO) 10 MG tablet Take 10 mg by mouth every morning. 03/19/21   [provider]  naproxen (NAPROSYN) 375 MG tablet Take 1 tablet (375 mg total) by mouth 2 (two) times daily. 04/30/22   Linwood Dibbles, MD  pseudoephedrine-acetaminophen (TYLENOL SINUS) 30-500 MG TABS tablet Take 1 tablet by mouth every 4 (four) hours as needed (cold symptoms).    [provider]  Sennosides (EX-LAX) 15 MG TABS Take 2 tablets by mouth daily as needed (constipation).    [provider]      Allergies    Patient has no known allergies.    Review of Systems   Review of Systems  All other systems reviewed and are negative.   Physical Exam Updated Vital Signs BP (!) 147/106 (BP Location: Left Arm)   Pulse (!) 56   Temp 98 F (36.7 C) (Oral)   Resp 16   Ht 4\' 11"  (1.499 m)   Wt 68 kg   LMP 04/19/2023   SpO2 100%   BMI 30.30 kg/m  Physical Exam Vitals and nursing note reviewed.  Constitutional:      Appearance: Normal appearance.  HENT:     Head: Normocephalic.  Pulmonary:     Effort: Pulmonary effort is normal.  Musculoskeletal:      Comments: The left knee appears grossly normal.  There is tenderness to palpation over the patella, but no swelling or effusion.  She has good range of motion.  There is no laxity and anterior and posterior drawer test is negative.  The left ankle has mil swelling and tenderness over the lateral malleolus.  She has good range of motion and joint appears stable.  Skin:    General: Skin is warm and dry.  Neurological:     Mental Status: She is alert.     ED Results / Procedures / Treatments   Labs (all labs ordered are listed, but only abnormal results are displayed) Labs Reviewed - No data to display  EKG None  Radiology No results found.  Procedures Procedures    Medications Ordered in ED Medications - No data to display  ED Course/ Medical Decision Making/ A&P  X-rays negative for fracture.  This to be treated as a sprain with rest, ibuprofen, and follow-up as needed.  Final Clinical Impression(s) / ED Diagnoses Final diagnoses:  None    Rx / DC Orders ED Discharge Orders     None         Geoffery Lyons, MD 05/01/23 0430

## 2023-05-01 NOTE — Discharge Instructions (Signed)
Rest.  Use your knee and ankle for 20 minutes every 2 hours while awake for the next 2 days.  Take ibuprofen 600 mg every 6 hours as needed for pain.  Follow-up with your primary doctor if not improving in the next week.

## 2023-06-02 ENCOUNTER — Other Ambulatory Visit: Payer: Self-pay

## 2023-06-02 ENCOUNTER — Emergency Department (HOSPITAL_BASED_OUTPATIENT_CLINIC_OR_DEPARTMENT_OTHER)
Admission: EM | Admit: 2023-06-02 | Discharge: 2023-06-02 | Disposition: A | Discharge status: Home / Self Care | Payer: 59 | Attending: Emergency Medicine | Admitting: Emergency Medicine

## 2023-06-02 ENCOUNTER — Encounter (HOSPITAL_BASED_OUTPATIENT_CLINIC_OR_DEPARTMENT_OTHER): Payer: Self-pay | Admitting: Emergency Medicine

## 2023-06-02 DIAGNOSIS — O219 Vomiting of pregnancy, unspecified: Secondary | ICD-10-CM | POA: Insufficient documentation

## 2023-06-02 DIAGNOSIS — Z3A01 Less than 8 weeks gestation of pregnancy: Secondary | ICD-10-CM | POA: Insufficient documentation

## 2023-06-02 DIAGNOSIS — E876 Hypokalemia: Secondary | ICD-10-CM | POA: Diagnosis not present

## 2023-06-02 DIAGNOSIS — Z7982 Long term (current) use of aspirin: Secondary | ICD-10-CM | POA: Diagnosis not present

## 2023-06-02 LAB — CBC WITH DIFFERENTIAL/PLATELET
Abs Immature Granulocytes: 0.03 10*3/uL (ref 0.00–0.07)
Basophils Absolute: 0 10*3/uL (ref 0.0–0.1)
Basophils Relative: 0 %
Eosinophils Absolute: 0.1 10*3/uL (ref 0.0–0.5)
Eosinophils Relative: 2 %
HCT: 37.6 % (ref 36.0–46.0)
Hemoglobin: 12.9 g/dL (ref 12.0–15.0)
Immature Granulocytes: 1 %
Lymphocytes Relative: 30 %
Lymphs Abs: 1.9 10*3/uL (ref 0.7–4.0)
MCH: 30.9 pg (ref 26.0–34.0)
MCHC: 34.3 g/dL (ref 30.0–36.0)
MCV: 90.2 fL (ref 80.0–100.0)
Monocytes Absolute: 0.4 10*3/uL (ref 0.1–1.0)
Monocytes Relative: 6 %
Neutro Abs: 3.8 10*3/uL (ref 1.7–7.7)
Neutrophils Relative %: 61 %
Platelets: 293 10*3/uL (ref 150–400)
RBC: 4.17 MIL/uL (ref 3.87–5.11)
RDW: 13.2 % (ref 11.5–15.5)
WBC: 6.2 10*3/uL (ref 4.0–10.5)
nRBC: 0 % (ref 0.0–0.2)

## 2023-06-02 LAB — URINALYSIS, ROUTINE W REFLEX MICROSCOPIC
Bilirubin Urine: NEGATIVE
Glucose, UA: NEGATIVE mg/dL
Hgb urine dipstick: NEGATIVE
Ketones, ur: NEGATIVE mg/dL
Nitrite: NEGATIVE
Protein, ur: 30 mg/dL — AB
Specific Gravity, Urine: 1.02 (ref 1.005–1.030)
pH: 7.5 (ref 5.0–8.0)

## 2023-06-02 LAB — COMPREHENSIVE METABOLIC PANEL
ALT: 13 U/L (ref 0–44)
AST: 20 U/L (ref 15–41)
Albumin: 4.1 g/dL (ref 3.5–5.0)
Alkaline Phosphatase: 37 U/L — ABNORMAL LOW (ref 38–126)
Anion gap: 10 (ref 5–15)
BUN: 8 mg/dL (ref 6–20)
CO2: 26 mmol/L (ref 22–32)
Calcium: 9 mg/dL (ref 8.9–10.3)
Chloride: 101 mmol/L (ref 98–111)
Creatinine, Ser: 0.74 mg/dL (ref 0.44–1.00)
GFR, Estimated: >60 mL/min (ref >60)
Glucose, Bld: 92 mg/dL (ref 70–99)
Potassium: 3.3 mmol/L — ABNORMAL LOW (ref 3.5–5.1)
Sodium: 137 mmol/L (ref 135–145)
Total Bilirubin: 0.6 mg/dL (ref 0.3–1.2)
Total Protein: 7.6 g/dL (ref 6.5–8.1)

## 2023-06-02 LAB — URINALYSIS, MICROSCOPIC (REFLEX)

## 2023-06-02 LAB — LIPASE, BLOOD: Lipase: 35 U/L (ref 11–51)

## 2023-06-02 LAB — PREGNANCY, URINE: Preg Test, Ur: POSITIVE — AB

## 2023-06-02 LAB — HCG, QUANTITATIVE, PREGNANCY: hCG, Beta Chain, Quant, S: 63066 m[IU]/mL — ABNORMAL HIGH (ref <5)

## 2023-06-02 MED ORDER — ONDANSETRON 4 MG PO TBDP
4.0000 mg | ORAL_TABLET | Freq: Once | ORAL | Status: AC
  Administered 2023-06-02: 4 mg via ORAL
  Filled 2023-06-02: qty 1

## 2023-06-02 MED ORDER — POTASSIUM CHLORIDE CRYS ER 20 MEQ PO TBCR
40.0000 meq | EXTENDED_RELEASE_TABLET | Freq: Once | ORAL | Status: AC
  Administered 2023-06-02: 40 meq via ORAL
  Filled 2023-06-02: qty 2

## 2023-06-02 MED ORDER — ONDANSETRON HCL 4 MG PO TABS: 4.0000 mg | ORAL_TABLET | Freq: Four times a day (QID) | ORAL | 0 refills | Status: AC

## 2023-06-02 NOTE — Discharge Instructions (Addendum)
Please follow-up with your OB or in the morning to help chronic hypertension for you for your recent symptoms and ER visit.  Today your pregnancy test came back positive which explains your symptoms.  Your potassium was also low and was replenished with potassium pill here today as well.  I have prescribed you Zofran to help with the nausea and vomiting.  If symptoms change or worsen please go to the [facility] in [place] as they have more resources for pregnant patients.

## 2023-06-02 NOTE — ED Provider Notes (Signed)
[place] [facility] AT [facility] Provider Note   CSN: [id number] Arrival date & time: 06/02/23  1036     History  Chief Complaint  Patient presents with   Abdominal Pain    Marissa Hull is a 32 y.o. female presenting with nausea vomiting for the past 3 days.  Patient dates that she was recently in [place] which she had approximately 3-5 alcoholic drinks.  Since Saturday patient has been having intractable nausea and vomiting to the point where she is that she cannot eat or drink as this will exacerbate her nausea.  Patient states her last period was June 9 but notes that she took Plan B around that time as she was sexually active.  Patient is denying any LOC, dizziness, low blood pressure, weakness, fever, abdominal pain, chest pain, shortness of breath, vaginal bleeding/discharge.  Patient says she gets nauseous in the morning which is similar to when she was pregnant previously.   Home Medications Prior to Admission medications   Medication Sig Start Date End Date Taking? Authorizing Provider  ondansetron (ZOFRAN) 4 MG tablet Take 1 tablet (4 mg total) by mouth every 6 (six) hours. 06/02/23  Yes Netta Corrigan, PA-C  aspirin EC 81 MG EC tablet Take 1 tablet (81 mg total) by mouth daily. Swallow whole. 03/28/21   Hughie Closs, MD  escitalopram (LEXAPRO) 10 MG tablet Take 10 mg by mouth every morning. 03/19/21   [provider]  naproxen (NAPROSYN) 375 MG tablet Take 1 tablet (375 mg total) by mouth 2 (two) times daily. 04/30/22   Linwood Dibbles, MD  pseudoephedrine-acetaminophen (TYLENOL SINUS) 30-500 MG TABS tablet Take 1 tablet by mouth every 4 (four) hours as needed (cold symptoms).    [provider]  Sennosides (EX-LAX) 15 MG TABS Take 2 tablets by mouth daily as needed (constipation).    [provider]      Allergies    Patient has no known allergies.    Review of Systems   Review of Systems  Gastrointestinal:  Positive for  abdominal pain.    Physical Exam Updated Vital Signs BP 119/81 (BP Location: Left Arm)   Pulse 75   Temp 98 F (36.7 C) (Oral)   Resp 16   Ht 4\' 11"  (1.499 m)   Wt 68 kg   LMP 04/19/2023   SpO2 100%   BMI 30.30 kg/m  Physical Exam Vitals reviewed.  Constitutional:      General: She is not in acute distress.    Comments: Resting comfortably on her phone  HENT:     Head: Normocephalic and atraumatic.  Eyes:     Extraocular Movements: Extraocular movements intact.     Conjunctiva/sclera: Conjunctivae normal.     Pupils: Pupils are equal, round, and reactive to light.  Cardiovascular:     Rate and Rhythm: Normal rate and regular rhythm.     Pulses: Normal pulses.     Heart sounds: Normal heart sounds.     Comments: 2+ bilateral radial/dorsalis pedis pulses with regular rate Pulmonary:     Effort: Pulmonary effort is normal. No respiratory distress.     Breath sounds: Normal breath sounds.  Abdominal:     Palpations: Abdomen is soft.     Tenderness: There is no abdominal tenderness. There is no guarding or rebound.  Musculoskeletal:        General: Normal range of motion.     Cervical back: Normal range of motion and neck supple.  Comments: 5 out of 5 bilateral grip/leg extension strength  Skin:    General: Skin is warm and dry.     Capillary Refill: Capillary refill takes less than 2 seconds.  Neurological:     General: No focal deficit present.     Mental Status: She is alert and oriented to person, place, and time.     Comments: Sensation intact in all 4 limbs  Psychiatric:        Mood and Affect: Mood normal.     ED Results / Procedures / Treatments   Labs (all labs ordered are listed, but only abnormal results are displayed) Labs Reviewed  COMPREHENSIVE METABOLIC PANEL - Abnormal; Notable for the following components:      Result Value   Potassium 3.3 (*)    Alkaline Phosphatase 37 (*)    All other components within normal limits  URINALYSIS, ROUTINE  W REFLEX MICROSCOPIC - Abnormal; Notable for the following components:   Protein, ur 30 (*)    Leukocytes,Ua TRACE (*)    All other components within normal limits  URINALYSIS, MICROSCOPIC (REFLEX) - Abnormal; Notable for the following components:   Bacteria, UA RARE (*)    All other components within normal limits  PREGNANCY, URINE - Abnormal; Notable for the following components:   Preg Test, Ur POSITIVE (*)    All other components within normal limits  CBC WITH DIFFERENTIAL/PLATELET  LIPASE, BLOOD  BETA HCG QUANT (REF LAB)  HCG, QUANTITATIVE, PREGNANCY    EKG None  Radiology No results found.  Procedures Procedures    Medications Ordered in ED Medications  ondansetron (ZOFRAN-ODT) disintegrating tablet 4 mg (has no administration in time range)  potassium chloride SA (KLOR-CON M) CR tablet 40 mEq (has no administration in time range)    ED Course/ Medical Decision Making/ A&P                             Medical Decision Making Amount and/or Complexity of Data Reviewed Labs: ordered.  Risk Prescription drug management.   [name] 32 y.o. presented today for nausea/vomiting. Working DDx that I considered at this time includes, but not limited to, pregnancy, gastroenteritis, colitis, small bowel obstruction, appendicitis, cholecystitis, pancreatitis, nephrolithiasis, AAA, UTI, pyelonephritis, ruptured ectopic pregnancy, PID, ovarian torsion.  R/o DDx: gastroenteritis, colitis, small bowel obstruction, appendicitis, cholecystitis, pancreatitis, nephrolithiasis, AAA, UTI, pyelonephritis, ruptured ectopic pregnancy, PID, ovarian torsion: These are considered less likely due to history of present illness and physical exam findings.  Review of prior external notes: 04/15/2023 office visit  Unique Tests and My Interpretation:  CBC with differential: Unremarkable CMP: hypokalemia 3.3 Lipase: Unremarkable UA: Unremarkable   Urine Pregnancy: Positive  Discussion  with Independent Historian: None  Discussion of Management of Tests: None  Risk: Medium: prescription drug management  Risk Stratification Score: None  Staffed with Earlene Plater, MD   Plan: On exam patient was no acute distress with stable vitals resting comfortably on her phone.  Patient had unremarkable physical exam have any abdominal tenderness.  Patient denies any vaginal bleeding discharge or dysuria.  Patient states that she gets sick in the morning which is similar to when she was previously pregnant and so a urine pregnancy was added to her labs in triage.  Labs came back significant for patient being pregnant which is most likely causing her symptoms.  At this time low suspicion of ectopic as patient is addressing any vaginal bleeding, hypotension, dizziness and has  reassuring physical exam.  At this time ultrasound is not indicated.  Patient's potassium was also low at 3.3 and will be replenished.  Patient was given Zofran for her nausea.  Patient was updated of this results and will be encouraged to follow-up with an OB.  Patient be prescribed Zofran for nausea as this is most likely related to her pregnancy as opposed to other etiology.  Encouraged patient do not drink alcohol as when I know she was pregnant and to avoid NSAIDs and other teratogenic medications.  Encouraged patient to follow-up with the [facility] if symptoms are to change or worsen as they have more resources for pregnant patients.  A beta hCG quantitative will be drawn to trend in the outpatient setting and the patient be discharged.  Patient was given return precautions. Patient stable for discharge at this time.  Patient verbalized understanding of plan.         Final Clinical Impression(s) / ED Diagnoses Final diagnoses:  Less than [redacted] weeks gestation of pregnancy  Hypokalemia    Rx / DC Orders ED Discharge Orders          Ordered    ondansetron (ZOFRAN) 4 MG tablet  Every 6 hours        06/02/23 1349               Remi Deter 06/02/23 1404    Laurence Spates, MD 06/03/23 [phone]

## 2023-06-02 NOTE — ED Notes (Signed)
Called lab, will add on quant hCG

## 2023-06-02 NOTE — ED Triage Notes (Signed)
Patient presents to ED via POV from home. Here with abdominal pain. Patient states "I drank too much over the weekend". Texting on phone during triage. Denies drinking daily or withdrawal symptoms. GCS 15. Ambulatory. Well appearing.

## 2023-06-02 NOTE — ED Notes (Signed)
Called lab, will add urine preg

## 2023-06-24 ENCOUNTER — Inpatient Hospital Stay (HOSPITAL_COMMUNITY): Payer: 59

## 2023-06-24 ENCOUNTER — Other Ambulatory Visit: Payer: Self-pay

## 2023-06-24 ENCOUNTER — Encounter (HOSPITAL_COMMUNITY): Payer: Self-pay

## 2023-06-24 ENCOUNTER — Inpatient Hospital Stay (HOSPITAL_COMMUNITY)
Admission: AD | Admit: 2023-06-24 | Discharge: 2023-06-24 | Disposition: A | Discharge status: Home / Self Care | Payer: 59 | Attending: Obstetrics & Gynecology | Admitting: Obstetrics & Gynecology

## 2023-06-24 DIAGNOSIS — N939 Abnormal uterine and vaginal bleeding, unspecified: Secondary | ICD-10-CM

## 2023-06-24 DIAGNOSIS — O071 Delayed or excessive hemorrhage following failed attempted termination of pregnancy: Secondary | ICD-10-CM | POA: Insufficient documentation

## 2023-06-24 DIAGNOSIS — O034 Incomplete spontaneous abortion without complication: Secondary | ICD-10-CM

## 2023-06-24 DIAGNOSIS — R102 Pelvic and perineal pain: Secondary | ICD-10-CM | POA: Diagnosis not present

## 2023-06-24 DIAGNOSIS — R1110 Vomiting, unspecified: Secondary | ICD-10-CM | POA: Insufficient documentation

## 2023-06-24 LAB — URINALYSIS, ROUTINE W REFLEX MICROSCOPIC
Bilirubin Urine: NEGATIVE
Glucose, UA: NEGATIVE mg/dL
Hgb urine dipstick: NEGATIVE
Ketones, ur: NEGATIVE mg/dL
Leukocytes,Ua: NEGATIVE
Nitrite: NEGATIVE
Protein, ur: NEGATIVE mg/dL
Specific Gravity, Urine: 1.01 (ref 1.005–1.030)
pH: 7 (ref 5.0–8.0)

## 2023-06-24 LAB — CBC
HCT: 31.9 % — ABNORMAL LOW (ref 36.0–46.0)
Hemoglobin: 10.9 g/dL — ABNORMAL LOW (ref 12.0–15.0)
MCH: 30.4 pg (ref 26.0–34.0)
MCHC: 34.2 g/dL (ref 30.0–36.0)
MCV: 89.1 fL (ref 80.0–100.0)
Platelets: 309 10*3/uL (ref 150–400)
RBC: 3.58 MIL/uL — ABNORMAL LOW (ref 3.87–5.11)
RDW: 13.3 % (ref 11.5–15.5)
WBC: 7.5 10*3/uL (ref 4.0–10.5)
nRBC: 0 % (ref 0.0–0.2)

## 2023-06-24 LAB — ABO/RH: ABO/RH(D): A POS

## 2023-06-24 LAB — BASIC METABOLIC PANEL
Anion gap: 13 (ref 5–15)
BUN: 6 mg/dL (ref 6–20)
CO2: 22 mmol/L (ref 22–32)
Calcium: 9.1 mg/dL (ref 8.9–10.3)
Chloride: 100 mmol/L (ref 98–111)
Creatinine, Ser: 0.64 mg/dL (ref 0.44–1.00)
GFR, Estimated: >60 mL/min (ref >60)
Glucose, Bld: 104 mg/dL — ABNORMAL HIGH (ref 70–99)
Potassium: 3.5 mmol/L (ref 3.5–5.1)
Sodium: 135 mmol/L (ref 135–145)

## 2023-06-24 LAB — HCG, SERUM, QUALITATIVE: Preg, Serum: POSITIVE — AB

## 2023-06-24 LAB — HCG, QUANTITATIVE, PREGNANCY: hCG, Beta Chain, Quant, S: 10725 m[IU]/mL — ABNORMAL HIGH (ref <5)

## 2023-06-24 MED ORDER — MISOPROSTOL 200 MCG PO TABS
800.0000 ug | ORAL_TABLET | Freq: Once | ORAL | Status: AC
  Administered 2023-06-24: 800 ug via ORAL
  Filled 2023-06-24: qty 4

## 2023-06-24 MED ORDER — OXYCODONE HCL 5 MG PO TABS: 5.0000 mg | ORAL_TABLET | Freq: Three times a day (TID) | ORAL | 0 refills | Status: AC | PRN

## 2023-06-24 MED ORDER — FENTANYL CITRATE (PF) 100 MCG/2ML IJ SOLN
12.5000 ug | Freq: Once | INTRAMUSCULAR | Status: AC
  Administered 2023-06-24: 12.5 ug via INTRAVENOUS
  Filled 2023-06-24: qty 2

## 2023-06-24 MED ORDER — LACTATED RINGERS IV BOLUS
1000.0000 mL | Freq: Once | INTRAVENOUS | Status: AC
  Administered 2023-06-24: 1000 mL via INTRAVENOUS

## 2023-06-24 MED ORDER — IBUPROFEN 800 MG PO TABS: 800.0000 mg | ORAL_TABLET | Freq: Three times a day (TID) | ORAL | 0 refills | Status: AC | PRN

## 2023-06-24 MED ORDER — MISOPROSTOL 200 MCG PO TABS: 400.0000 ug | ORAL_TABLET | Freq: Three times a day (TID) | ORAL | 0 refills | Status: AC

## 2023-06-24 MED ORDER — SODIUM CHLORIDE 0.9 % IV SOLN
8.0000 mg | Freq: Once | INTRAVENOUS | Status: AC
  Administered 2023-06-24: 8 mg via INTRAVENOUS
  Filled 2023-06-24: qty 4

## 2023-06-24 NOTE — MAU Provider Note (Signed)
MAU Provider Note  History  [id number]  Arrival date and time: 06/24/23 0159   Chief Complaint  Patient presents with   Vaginal Bleeding   Emesis     HPI Marissa Hull is a 32 y.o. [id number] s/p recent elective termination of pregnancy who presents with pelvic pain and vaginal bleeding, nausea.  She is a transfer from main ED, where she had presented after fainting at work around 00 45 today, with ongoing headache and 4 episodes of emesis.  She is complaining of abdominal cramping.  She had a medical abortion with 4 pills a Cytotec on 7/31.  After that, she noted she passed tissue on Saturday.  She went back to the abortion clinic where she obtained her initial prescription and an ultrasound was done, noted to have retained products so she was given another 4 pills of Cytotec.  She did not take these, however.  She had been taken Motrin and Tylenol, but these have not been effective for her pain.  In the main ED, UA unremarkable, serum hCG positive.  Hemoglobin 10.9, down from 12.9, 3 weeks ago.  BMP unremarkable.  Vaginal bleeding: Yes LOF: Yes Fetal Movement: No Contractions: No  --/--/A POS (08/13 0454)  OB History  Gravida Para Term Preterm AB Living  5 2 2   2 2   SAB IAB Ectopic Multiple Live Births    2     2    # Outcome Date GA Lbr Len/2nd Weight Sex Type Anes PTL Lv  5 Current           4 Term 2016        LIV  3 Term 2014        LIV  2 IAB           1 IAB              Past Medical History:  Diagnosis Date   TIA (transient ischemic attack)     Past Surgical History:  Procedure Laterality Date   INDUCED ABORTION      History reviewed. No pertinent family history.  Social History   Socioeconomic History   Marital status: Single    Spouse name: Not on file   Number of children: Not on file   Years of education: Not on file   Highest education level: Not on file  Occupational History   Not on file  Tobacco Use   Smoking status: Never   Smokeless  tobacco: Never  Vaping Use   Vaping status: Never Used  Substance and Sexual Activity   Alcohol use: No   Drug use: Never   Sexual activity: Not Currently  Other Topics Concern   Not on file  Social History Narrative   Not on file   Social Determinants of Health   Financial Resource Strain: Medium Risk (12/06/2022)   Received from [facility], [facility]   Overall Financial Resource Strain (CARDIA)    Difficulty of Paying Living Expenses: Somewhat hard  Food Insecurity: Food Insecurity Present (12/06/2022)   Received from [facility], [facility]   Hunger Vital Sign    Worried About Running Out of Food in the Last Year: Often true    Ran Out of Food in the Last Year: Sometimes true  Transportation Needs: Unmet Transportation Needs (12/06/2022)   Received from [employer], [facility]   PRAPARE - Transportation    Lack of Transportation (Medical): Yes    Lack of Transportation (Non-Medical): Yes  Physical Activity:  Inactive (12/06/2022)   Received from [facility], [facility]   Exercise Vital Sign    Days of Exercise per Week: 3 days    Minutes of Exercise per Session: 0 min  Stress: Stress Concern Present (12/06/2022)   Received from [facility], [facility] Occupational [facility] - Occupational Stress Questionnaire    Feeling of Stress : Very much  Social Connections: Somewhat Isolated (12/06/2022)   Received from [facility], [facility]   Social Network    How would you rate your social network (family, work, friends)?: Restricted participation with some degree of social isolation  Intimate Partner Violence: At Risk (12/06/2022)   Received from [facility], [facility]   HITS    Over the last 12 months how often did your partner physically hurt you?: 3    Over the last 12 months how often did your partner insult you or talk down to you?: 3    Over the last 12 months how often did your partner threaten you with physical harm?:  2    Over the last 12 months how often did your partner scream or curse at you?: 3    No Known Allergies  No current facility-administered medications on file prior to encounter.   Current Outpatient Medications on File Prior to Encounter  Medication Sig Dispense Refill   diphenhydramine-acetaminophen (TYLENOL PM) 25-500 MG TABS tablet Take 1 tablet by mouth at bedtime as needed.     hydrOXYzine (ATARAX) 10 MG tablet Take 10 mg by mouth 3 (three) times daily as needed for anxiety.     ibuprofen (ADVIL) 800 MG tablet Take 800 mg by mouth every 8 (eight) hours as needed for moderate pain or mild pain.     aspirin EC 81 MG EC tablet Take 1 tablet (81 mg total) by mouth daily. Swallow whole. 30 tablet 1   escitalopram (LEXAPRO) 10 MG tablet Take 10 mg by mouth every morning.     naproxen (NAPROSYN) 375 MG tablet Take 1 tablet (375 mg total) by mouth 2 (two) times daily. 20 tablet 0   ondansetron (ZOFRAN) 4 MG tablet Take 1 tablet (4 mg total) by mouth every 6 (six) hours. 12 tablet 0   pseudoephedrine-acetaminophen (TYLENOL SINUS) 30-500 MG TABS tablet Take 1 tablet by mouth every 4 (four) hours as needed (cold symptoms).     Sennosides (EX-LAX) 15 MG TABS Take 2 tablets by mouth daily as needed (constipation).      ROS: Pertinent positives and negative per HPI, all others reviewed and negative  Physical Exam   BP (!) 132/91   Pulse 71   Temp 98.7 F (37.1 C) (Oral)   Resp 16   Ht 4\' 11"  (1.499 m)   Wt 70.4 kg   LMP 04/19/2023   SpO2 100%   BMI 31.37 kg/m   Patient Vitals for the past 24 hrs:  BP Temp Temp src Pulse Resp SpO2 Height Weight  06/24/23 0505 (!) 132/91 -- -- 71 -- -- -- --  06/24/23 0346 (!) 124/90 98.7 F (37.1 C) Oral 79 16 100 % 4\' 11"  (1.499 m) 70.4 kg  06/24/23 0210 -- -- -- -- -- -- 4\' 11"  (1.499 m) 68 kg  06/24/23 0209 -- -- -- -- -- 100 % -- --  06/24/23 0206 (!) 139/96 98.5 F (36.9 C) Oral 83 15 100 % -- --    Physical Exam Vitals reviewed.   Constitutional:      Appearance:  Normal appearance.  HENT:     Head: Normocephalic and atraumatic.     Right Ear: External ear normal.     Left Ear: External ear normal.  Cardiovascular:     Rate and Rhythm: Normal rate and regular rhythm.  Pulmonary:     Effort: Pulmonary effort is normal.     Breath sounds: Normal breath sounds.  Abdominal:     General: Abdomen is flat.     Palpations: Abdomen is soft.     Tenderness: There is abdominal tenderness (Pelvic, hypogastric).  Skin:    General: Skin is warm.     Capillary Refill: Capillary refill takes less than 2 seconds.  Psychiatric:        Mood and Affect: Mood normal.     Labs Results for orders placed or performed during the hospital encounter of 06/24/23 (from the past 24 hour(s))  Urinalysis, Routine w reflex microscopic -Urine, Clean Catch     Status: None   Collection Time: 06/24/23  2:14 AM  Result Value Ref Range   Color, Urine YELLOW YELLOW   APPearance CLEAR CLEAR   Specific Gravity, Urine 1.010 1.005 - 1.030   pH 7.0 5.0 - 8.0   Glucose, UA NEGATIVE NEGATIVE mg/dL   Hgb urine dipstick NEGATIVE NEGATIVE   Bilirubin Urine NEGATIVE NEGATIVE   Ketones, ur NEGATIVE NEGATIVE mg/dL   Protein, ur NEGATIVE NEGATIVE mg/dL   Nitrite NEGATIVE NEGATIVE   Leukocytes,Ua NEGATIVE NEGATIVE  hCG, serum, qualitative     Status: Abnormal   Collection Time: 06/24/23  2:19 AM  Result Value Ref Range   Preg, Serum POSITIVE (A) NEGATIVE  CBC     Status: Abnormal   Collection Time: 06/24/23  2:19 AM  Result Value Ref Range   WBC 7.5 4.0 - 10.5 K/uL   RBC 3.58 (L) 3.87 - 5.11 MIL/uL   Hemoglobin 10.9 (L) 12.0 - 15.0 g/dL   HCT 16.1 (L) 09.6 - 04.5 %   MCV 89.1 80.0 - 100.0 fL   MCH 30.4 26.0 - 34.0 pg   MCHC 34.2 30.0 - 36.0 g/dL   RDW 40.9 81.1 - 91.4 %   Platelets 309 150 - 400 K/uL   nRBC 0.0 0.0 - 0.2 %  Basic metabolic panel     Status: Abnormal   Collection Time: 06/24/23  2:19 AM  Result Value Ref Range   Sodium  135 135 - 145 mmol/L   Potassium 3.5 3.5 - 5.1 mmol/L   Chloride 100 98 - 111 mmol/L   CO2 22 22 - 32 mmol/L   Glucose, Bld 104 (H) 70 - 99 mg/dL   BUN 6 6 - 20 mg/dL   Creatinine, Ser 7.82 0.44 - 1.00 mg/dL   Calcium 9.1 8.9 - 95.6 mg/dL   GFR, Estimated >21 >30 mL/min   Anion gap 13 5 - 15  hCG, quantitative, pregnancy     Status: Abnormal   Collection Time: 06/24/23  4:54 AM  Result Value Ref Range   hCG, Beta Chain, Quant, S 10,725 (H) <5 mIU/mL  ABO/Rh     Status: None   Collection Time: 06/24/23  4:54 AM  Result Value Ref Range   ABO/RH(D) A POS    No rh immune globuloin      NOT A RH IMMUNE GLOBULIN CANDIDATE, PT RH POSITIVE Performed at [facility], [address] [place], [place] [address]     Imaging US OB LESS THAN 14 WEEKS WITH OB TRANSVAGINAL  Result  Date: 06/24/2023 CLINICAL DATA:  Initial evaluation for acute pelvic pain, vaginal bleeding, recent medical abortion. EXAM: OBSTETRIC <14 WK [place] AND TRANSVAGINAL OB US TECHNIQUE: Both transabdominal and transvaginal ultrasound examinations were performed for complete evaluation of the gestation as well as the maternal uterus, adnexal regions, and pelvic cul-de-sac. Transvaginal technique was performed to assess early pregnancy. COMPARISON:  None Available. FINDINGS: Intrauterine gestational sac: Negative. Yolk sac:  Negative. Embryo:  Negative. Cardiac Activity: Negative. Heart Rate: N/A Subchorionic hemorrhage:  None visualized. Maternal uterus/adnexae: Ovaries within normal limits. No adnexal mass or free fluid. Endometrial stripe demonstrates a heterogeneous echotexture and is thickened measuring up to 27 mm. Scattered areas of internal vascularity seen within the thickened endometrium. IMPRESSION: 1. No IUP. Endometrial stripe thickened measuring 27 mm with internal vascularity. Given provided history of recent abortion, findings are concerning for retained products of conception. 2. No other acute maternal uterine or  adnexal abnormality. Electronically Signed   By: Rise Mu M.D.   On: 06/24/2023 05:42    MAU Course  MDM: moderate  This patient presents to the ED for concern of   Chief Complaint  Patient presents with   Vaginal Bleeding   Emesis     These complains involves an extensive number of treatment options, and is a complaint that carries with it a high risk of complications and morbidity.  The differential diagnosis for  1.  Pelvic pain in pregnancy INCLUDES postabortion complication, ectopic pregnancy, ovarian cyst  Co morbidities that complicate the patient evaluation: Recent elective termination  External records from outside source obtained and reviewed including Prenatal care records  Lab Tests: UA, CBC, and BHCG  I ordered, and personally interpreted labs.  The pertinent results include: All the above  Imaging Studies ordered:  I ordered imaging studies includingTransvaginal [place] I independently visualized and interpreted imaging which showed thickened endometrial stripe with vascularity I agree with the radiologist interpretation  Cardiac Testing/Monitoring:  EKG was not ordered today.   Medicines ordered and prescription drug management:  Medications: Fentanyl, ondansetron, IV fluids   Reevaluation of the patient after these medicines showed that the patient improved I have reviewed the patients home medicines and have made adjustments as needed  Test Considered: None  Critical Interventions: IV fluids  Consultations Obtained: None  MAU Course: -7829: Patient evaluated, lower bilateral abdominal pain/pelvic pain to palpation.  No rebound tenderness.  Admitted nausea and recent vomiting, decreased p.o. intake.  Ordered IV fluids, beta-hCG, ultrasound, antiemetics, pain meds. - 5621: Ultrasound with thickened endometrial stripe 27 mm with increased vascularity.  Patient's pain is more controlled, however still experiencing cramping.  Gave 800 mcg Cytotec.  Rx  home for 400 mcg 3 times daily for 3 days.  Gave return precautions.  Wrote a note for work.  After the interventions noted above, I reevaluated the patient and found that they have :improved  Dispostion: discharged   Assessment and Plan  1. Incomplete abortion - Discharge patient  2. Pelvic pain in female - Discharge patient  3. Vagina bleeding - Discharge patient   Patient seen for issues related to recent elective termination.  See MAU course above.  Gave return precautions.  Discharge Instructions     Discharge patient   Complete by: As directed    Discharge disposition: 01-Home or Self Care   Discharge patient date: 06/24/2023      Allergies as of 06/24/2023   No Known Allergies      Medication List     TAKE  these medications    aspirin EC 81 MG tablet Take 1 tablet (81 mg total) by mouth daily. Swallow whole.   diphenhydramine-acetaminophen 25-500 MG Tabs tablet Commonly known as: TYLENOL PM Take 1 tablet by mouth at bedtime as needed.   escitalopram 10 MG tablet Commonly known as: LEXAPRO Take 10 mg by mouth every morning.   Ex-Lax 15 MG Tabs Generic drug: Sennosides Take 2 tablets by mouth daily as needed (constipation).   hydrOXYzine 10 MG tablet Commonly known as: ATARAX Take 10 mg by mouth 3 (three) times daily as needed for anxiety.   ibuprofen 800 MG tablet Commonly known as: ADVIL Take 1 tablet (800 mg total) by mouth every 8 (eight) hours as needed. What changed: reasons to take this   misoprostol 200 MCG tablet Commonly known as: Cytotec Take 2 tablets (400 mcg total) by mouth 3 (three) times daily for 3 days.   naproxen 375 MG tablet Commonly known as: NAPROSYN Take 1 tablet (375 mg total) by mouth 2 (two) times daily.   ondansetron 4 MG tablet Commonly known as: ZOFRAN Take 1 tablet (4 mg total) by mouth every 6 (six) hours.   oxyCODONE 5 MG immediate release tablet Commonly known as: Roxicodone Take 1 tablet (5 mg total) by  mouth every 8 (eight) hours as needed for up to 7 days.   pseudoephedrine-acetaminophen 30-500 MG Tabs tablet Commonly known as: TYLENOL SINUS Take 1 tablet by mouth every 4 (four) hours as needed (cold symptoms).        Myrtie Hawk, DO FMOB Fellow, Faculty practice [facility], Center for [facility] 06/24/23  6:26 AM

## 2023-06-24 NOTE — MAU Note (Signed)
.  Marissa Hull is a 32 y.o. at Unknown here in MAU reporting: faint at work today @ 0045, HA, and N/V with 4 episodes of emesis. Pt reports lower ABD cramping and VB with clotting with voiding. Pt states the cramping and VB has been since she had her medical abortion 7/30.  Pt denies taking any pain medications today, but has been taking Motrin 800mg  and Tylenol PM.   Onset of complaint: 0045 Pain score: 10/10 ABD and HA Vitals:   06/24/23 0206 06/24/23 0209  BP: (!) 139/96   Pulse: 83   Resp: 15   Temp: 98.5 F (36.9 C)   SpO2: 100% 100%      Lab orders placed from triage:

## 2023-06-24 NOTE — ED Triage Notes (Signed)
Patient reports taking the abortion pills on 06/11/23, reports was prescribed to her by clinic. Reports fainting at 0045 tonight while at work.   Reports having abdominal, headache, vomiting x4, and lightheaded. Also reports has vaginal bleeding since 06/11/23.  Pain 10/10  When asked location of pain patient pointed to pelvic area.

## 2023-06-24 NOTE — ED Provider Notes (Signed)
  2:56 AM Marissa Hull is a 32 y.o. female, G5P2, at Unknown gestation who presents to the emergency department with complaints of pelvic pain and vaginal bleeding. Patient recently took abortion pills on 06/11/23. Has had some ongoing pelvic pain/cramping and vaginal bleeding. Bleeding has slowed, but not completely stopped. Reports returning to the clinic today and being told she had retained products; she was given an additional "4 pills" to take because they "didn't want to do the surgery". Notes some chills and lightheadedness. No documented fevers. Reports hx of 2 prior elective abortions.  Review of  Systems  Positive: As above Negative: As above  Physical Exam  BP (!) 139/96 (BP Location: Right Arm)   Pulse 83   Temp 98.5 F (36.9 C) (Oral)   Resp 15   Ht 4\' 11"  (1.499 m)   Wt 68 kg   LMP 04/19/2023   SpO2 100%   BMI 30.30 kg/m  General: Awake, appears uncomfortable; fidgety HEENT: Atraumatic  Resp: Normal effort  Cardiac: Normal rate Abd: Soft, nondistended MSK: Moves all extremities without difficulty Neuro: Speech clear  Medical Decision Making  Pt evaluated for pregnancy concern and is stable for transfer to MAU. Pt is in agreement with plan for transfer.  2:52 AM Discussed with MD Camelia Phenes who accepts patient in transfer.  Clinical Impression   Pelvic pain in female     Antony Madura, Cordelia Poche 06/24/23 0259    Gilda Crease, MD 06/25/23 [phone]

## 2024-11-17 ENCOUNTER — Ambulatory Visit
Admission: EM | Admit: 2024-11-17 | Discharge: 2024-11-17 | Disposition: A | Discharge status: Home / Self Care | Attending: Student | Admitting: Student

## 2024-11-17 ENCOUNTER — Encounter: Payer: Self-pay | Admitting: Emergency Medicine

## 2024-11-17 DIAGNOSIS — S46811A Strain of other muscles, fascia and tendons at shoulder and upper arm level, right arm, initial encounter: Secondary | ICD-10-CM | POA: Diagnosis not present

## 2024-11-17 MED ORDER — METHYLPREDNISOLONE 4 MG PO TBPK: ORAL_TABLET | Freq: Every day | ORAL | 0 refills | Status: AC

## 2024-11-17 MED ORDER — BACLOFEN 10 MG PO TABS: 10.0000 mg | ORAL_TABLET | Freq: Three times a day (TID) | ORAL | 0 refills | Status: AC

## 2024-11-17 NOTE — ED Triage Notes (Signed)
 Pt c/o right shoulder and arm pain that began today. States at work she reaches a lot with that arm and she has had this happen in the past.

## 2024-11-17 NOTE — Discharge Instructions (Addendum)
-  You have tense muscles in your trapezius muscle, which are pressing on a nerve as it leaves your neck and goes down your arm. The tingling in your fingers is from the pinched nerve. -You can take the muscle relaxer Baclofen  up to 3 times daily for muscle spasms and pain.  This medication will make you drowsy, so take it when you are home and do not need to drive. -Medrol  Dosepack - this is a steroid - follow instructions on the package.  -You can take Tylenol  up to 1000 mg 3 times daily, and ibuprofen  up to 600 mg 3 times daily with food.  You can take these together, or alternate every 3-4 hours. --->You will probably need physical therapy, and additional work restrictions. I would recommend following-up with your primary care provider, who can either provide this, or send a referral to physical therapy/orthopedics.

## 2024-11-17 NOTE — ED Provider Notes (Signed)
 [facility]    CSN: [id number] Arrival date & time: 11/17/24  1805      History   Chief Complaint Chief Complaint  Patient presents with   Arm Pain   Shoulder Pain    HPI Marissa Hull is a 34 y.o. female presenting with right shoulder/arm pain.  History of complex migraine, TIA. Pt c/o right shoulder and arm pain that began today. States at work she reaches a lot with that arm and she has had this happen in the past.  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  She is right-handed.   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 Authorizing Provider  baclofen  (LIORESAL ) 10 MG tablet Take 1 tablet (10 mg total) by mouth 3 (three) times daily. 11/17/24  Yes Margarita Bobrowski E, PA-C  methylPREDNISolone  (MEDROL  DOSEPAK) 4 MG TBPK tablet Take by mouth daily for 6 days. 11/17/24 11/23/24 Yes Arlyss Leita BRAVO, PA-C  aspirin  EC 81 MG EC tablet Take 1 tablet (81 mg total) by mouth daily. Swallow whole. 03/28/21   Vernon Ranks, MD  diphenhydramine-acetaminophen  (TYLENOL  PM) 25-500 MG TABS  tablet Take 1 tablet by mouth at bedtime as needed.    [provider]  escitalopram (LEXAPRO) 10 MG tablet Take 10 mg by mouth every morning. 03/19/21   [provider]  hydrOXYzine (ATARAX) 10 MG tablet Take 10 mg by mouth 3 (three) times daily as needed for anxiety.    [provider]  ibuprofen  (ADVIL ) 800 MG tablet Take 1 tablet (800 mg total) by mouth every 8 (eight) hours as needed. 06/24/23   Mercado-Ortiz, Harlene RODES, DO  misoprostol  (CYTOTEC ) 200 MCG tablet Take 2 tablets (400 mcg total) by mouth 3 (three) times daily for 3 days. 06/24/23 06/27/23  Mercado-Ortiz, Harlene RODES, DO  naproxen  (NAPROSYN ) 375 MG tablet Take 1 tablet (375 mg total) by mouth 2 (two) times daily. 04/30/22   Randol Simmonds, MD  ondansetron  (ZOFRAN ) 4 MG tablet Take 1 tablet (4 mg total) by mouth every 6 (six) hours. 06/02/23   Victor Lynwood DASEN, PA-C  pseudoephedrine-acetaminophen  (TYLENOL  SINUS) 30-500 MG TABS tablet Take 1 tablet by mouth every 4 (four) hours as needed (cold symptoms).    [provider]  Sennosides (EX-LAX) 15 MG TABS  Take 2 tablets by mouth daily as needed (constipation).    [provider]    Family History History reviewed. No pertinent family history.  Social History Social History[1]   Allergies   Patient has no known allergies.   Review of Systems Review of Systems  Musculoskeletal:        R shoulder/ arm pain     Physical Exam Triage Vital Signs ED Triage Vitals  Encounter Vitals Group     BP      Girls Systolic BP Percentile      Girls Diastolic BP Percentile      Boys Systolic BP Percentile      Boys Diastolic BP Percentile      Pulse      Resp      Temp      Temp src      SpO2      Weight      Height      Head Circumference      Peak Flow      Pain Score      Pain Loc      Pain Education      Exclude from Growth Chart    No data found.  Updated Vital Signs BP 102/75 (BP Location: Right Arm)   Pulse 70   Temp 98 F  (36.7 C) (Oral)   Resp 16   LMP 10/29/2024 (Approximate)   SpO2 98%   Breastfeeding No   Visual Acuity Right Eye Distance:   Left Eye Distance:   Bilateral Distance:    Right Eye Near:   Left Eye Near:    Bilateral Near:     Physical Exam Vitals reviewed.  Constitutional:      General: She is not in acute distress.    Appearance: Normal appearance. She is not ill-appearing.  HENT:     Head: Normocephalic and atraumatic.  Pulmonary:     Effort: Pulmonary effort is normal.  Musculoskeletal:     Comments: The right proximal trapezius muscles are tense and tender to palpation.  There is no midline spinous tenderness or deformity palpated.  Right proximal trapezius pain is elicited with abduction right arm.  Negative right Spurling.  Neurological:     General: No focal deficit present.     Mental Status: She is alert and oriented to person, place, and time.  Psychiatric:        Mood and Affect: Mood normal.        Behavior: Behavior normal.        Thought Content: Thought content normal.        Judgment: Judgment normal.      UC Treatments / Results  Labs (all labs ordered are listed, but only abnormal results are displayed) Labs Reviewed - No data to display  EKG   Radiology No results found.  Procedures Procedures (including critical care time)  Medications Ordered in UC Medications - No data to display  Initial Impression / Assessment and Plan / UC Course  I have reviewed the triage vital signs and the nursing notes.  Pertinent labs & imaging results that were available during my care of the patient were reviewed by me and considered in my medical decision making (see chart for details).     Patient is a pleasant 34 y.o. female presenting with R trapezius strain. The patient is afebrile and nontachycardic.  Antipyretic has not been administered today. No red flag symptoms.  LMP 10/29/2024, states she is not  pregnant or breast-feeding, declines pregnancy  test.  Offered IM Solu-Medrol .  The patient declines, in favor of Medrol  Dosepak.  Limit NSAIDs while taking this medication.  Baclofen  sent.  Given this is a recurrent issue, and she has residual right sided weakness from her stroke, I recommended that she follow-up with PCP; they may further advise Ortho/PT referral.  I provided her with a work note today stating that she should not do repetitive overhead motions, needs clearance to perform this task again.  Final Clinical Impressions(s) / UC Diagnoses   Final diagnoses:  Strain of right trapezius muscle, initial encounter     Discharge Instructions      -You have tense muscles in your trapezius muscle, which are pressing on a nerve as it leaves your neck and goes down your arm. The tingling in your fingers is from the pinched nerve. -You can take the muscle relaxer Baclofen  up to 3 times daily for muscle spasms and pain.  This medication will make you drowsy, so take it when you are home and do not need to drive. -Medrol  Dosepack - this is a steroid - follow instructions on the package.  -You can take Tylenol  up to 1000 mg 3 times daily, and ibuprofen  up to 600 mg 3 times daily with food.  You can take these together, or alternate every 3-4 hours. --->You will probably need physical therapy, and additional work restrictions. I would recommend following-up with your primary care provider, who can either provide this, or send a referral to physical therapy/orthopedics.      ED Prescriptions     Medication Sig Dispense Auth. Provider   methylPREDNISolone  (MEDROL  DOSEPAK) 4 MG TBPK tablet Take by mouth daily for 6 days. 1 each Niki Payment E, PA-C   baclofen  (LIORESAL ) 10 MG tablet Take 1 tablet (10 mg total) by mouth 3 (three) times daily. 30 each Arlyss Leita BRAVO, PA-C      PDMP not reviewed this encounter.     [1]  Social History Tobacco Use   Smoking status: Never   Smokeless tobacco: Never  Vaping Use   Vaping status:  Never Used  Substance Use Topics   Alcohol use: No   Drug use: Never     Taylynn Easton E, PA-C 11/17/24 1835  "

## 2024-12-01 ENCOUNTER — Ambulatory Visit: Admission: EM | Admit: 2024-12-01 | Discharge: 2024-12-01 | Disposition: A | Discharge status: Home / Self Care
